# Patient Record
Sex: Female | Born: 1960
Health system: Southern US, Community
[De-identification: ages and names within clinical notes are randomized; demographics above are authoritative.]

## PROBLEM LIST (undated history)

## (undated) DIAGNOSIS — K219 Gastro-esophageal reflux disease without esophagitis: Secondary | ICD-10-CM

## (undated) DIAGNOSIS — I10 Essential (primary) hypertension: Secondary | ICD-10-CM

## (undated) DIAGNOSIS — R079 Chest pain, unspecified: Secondary | ICD-10-CM

## (undated) DIAGNOSIS — T7840XA Allergy, unspecified, initial encounter: Secondary | ICD-10-CM

## (undated) HISTORY — PX: BREAST BIOPSY: SHX20

## (undated) HISTORY — PX: TUBAL LIGATION: SHX77

## (undated) HISTORY — DX: Allergy, unspecified, initial encounter: T78.40XA

## (undated) HISTORY — DX: Essential (primary) hypertension: I10

## (undated) HISTORY — DX: Chest pain, unspecified: R07.9

## (undated) HISTORY — DX: Gastro-esophageal reflux disease without esophagitis: K21.9

---

## 1997-11-07 ENCOUNTER — Emergency Department (HOSPITAL_COMMUNITY): Admission: EM | Admit: 1997-11-07 | Discharge: 1997-11-07 | Payer: Self-pay | Admitting: Emergency Medicine

## 1998-04-04 ENCOUNTER — Encounter: Admission: RE | Admit: 1998-04-04 | Discharge: 1998-04-04 | Payer: Self-pay | Admitting: Sports Medicine

## 1998-05-05 ENCOUNTER — Encounter: Admission: RE | Admit: 1998-05-05 | Discharge: 1998-05-05 | Payer: Self-pay | Admitting: Family Medicine

## 1999-12-11 ENCOUNTER — Encounter: Admission: RE | Admit: 1999-12-11 | Discharge: 1999-12-11 | Payer: Self-pay | Admitting: Family Medicine

## 1999-12-12 ENCOUNTER — Encounter: Admission: RE | Admit: 1999-12-12 | Discharge: 1999-12-12 | Payer: Self-pay | Admitting: Family Medicine

## 1999-12-26 ENCOUNTER — Encounter: Admission: RE | Admit: 1999-12-26 | Discharge: 1999-12-26 | Payer: Self-pay | Admitting: Family Medicine

## 1999-12-26 ENCOUNTER — Encounter: Admission: RE | Admit: 1999-12-26 | Discharge: 1999-12-26 | Payer: Self-pay | Admitting: Sports Medicine

## 2001-12-19 ENCOUNTER — Encounter: Payer: Self-pay | Admitting: Family Medicine

## 2001-12-19 ENCOUNTER — Encounter: Admission: RE | Admit: 2001-12-19 | Discharge: 2001-12-19 | Payer: Self-pay | Admitting: Family Medicine

## 2002-07-22 ENCOUNTER — Encounter: Admission: RE | Admit: 2002-07-22 | Discharge: 2002-07-22 | Payer: Self-pay | Admitting: Family Medicine

## 2002-09-24 ENCOUNTER — Encounter: Admission: RE | Admit: 2002-09-24 | Discharge: 2002-09-24 | Payer: Self-pay | Admitting: Family Medicine

## 2003-02-16 ENCOUNTER — Encounter: Admission: RE | Admit: 2003-02-16 | Discharge: 2003-02-16 | Payer: Self-pay | Admitting: Sports Medicine

## 2003-11-02 ENCOUNTER — Emergency Department (HOSPITAL_COMMUNITY): Admission: EM | Admit: 2003-11-02 | Discharge: 2003-11-02 | Payer: Self-pay | Admitting: Emergency Medicine

## 2004-03-01 ENCOUNTER — Ambulatory Visit: Payer: Self-pay | Admitting: Family Medicine

## 2004-05-04 ENCOUNTER — Emergency Department (HOSPITAL_COMMUNITY): Admission: EM | Admit: 2004-05-04 | Discharge: 2004-05-04 | Payer: Self-pay | Admitting: Family Medicine

## 2004-05-11 ENCOUNTER — Ambulatory Visit: Payer: Self-pay | Admitting: Family Medicine

## 2004-06-20 ENCOUNTER — Ambulatory Visit: Payer: Self-pay | Admitting: Family Medicine

## 2004-09-11 ENCOUNTER — Encounter: Payer: Self-pay | Admitting: Family Medicine

## 2004-09-11 ENCOUNTER — Ambulatory Visit: Payer: Self-pay | Admitting: Family Medicine

## 2005-01-16 ENCOUNTER — Emergency Department (HOSPITAL_COMMUNITY): Admission: EM | Admit: 2005-01-16 | Discharge: 2005-01-16 | Payer: Self-pay | Admitting: Family Medicine

## 2005-07-06 ENCOUNTER — Ambulatory Visit: Payer: Self-pay | Admitting: Family Medicine

## 2005-07-17 ENCOUNTER — Ambulatory Visit: Payer: Self-pay | Admitting: Family Medicine

## 2005-07-31 ENCOUNTER — Encounter: Admission: RE | Admit: 2005-07-31 | Discharge: 2005-07-31 | Payer: Self-pay | Admitting: Sports Medicine

## 2005-08-07 ENCOUNTER — Ambulatory Visit: Payer: Self-pay | Admitting: Sports Medicine

## 2005-08-20 ENCOUNTER — Encounter: Admission: RE | Admit: 2005-08-20 | Discharge: 2005-08-20 | Payer: Self-pay | Admitting: Sports Medicine

## 2005-08-28 ENCOUNTER — Encounter (INDEPENDENT_AMBULATORY_CARE_PROVIDER_SITE_OTHER): Payer: Self-pay | Admitting: *Deleted

## 2005-08-29 ENCOUNTER — Ambulatory Visit: Payer: Self-pay | Admitting: Family Medicine

## 2005-09-26 ENCOUNTER — Ambulatory Visit: Payer: Self-pay | Admitting: Family Medicine

## 2006-05-31 ENCOUNTER — Ambulatory Visit: Payer: Self-pay | Admitting: Family Medicine

## 2006-05-31 ENCOUNTER — Encounter (INDEPENDENT_AMBULATORY_CARE_PROVIDER_SITE_OTHER): Payer: Self-pay | Admitting: Family Medicine

## 2006-05-31 LAB — CONVERTED CEMR LAB
Chlamydia, DNA Probe: NEGATIVE
GC Probe Amp, Genital: NEGATIVE

## 2006-06-27 DIAGNOSIS — N393 Stress incontinence (female) (male): Secondary | ICD-10-CM

## 2006-06-27 DIAGNOSIS — D649 Anemia, unspecified: Secondary | ICD-10-CM

## 2006-06-28 ENCOUNTER — Encounter (INDEPENDENT_AMBULATORY_CARE_PROVIDER_SITE_OTHER): Payer: Self-pay | Admitting: *Deleted

## 2007-05-14 ENCOUNTER — Encounter: Payer: Self-pay | Admitting: *Deleted

## 2007-05-20 ENCOUNTER — Ambulatory Visit: Payer: Self-pay | Admitting: Family Medicine

## 2007-06-10 ENCOUNTER — Encounter (INDEPENDENT_AMBULATORY_CARE_PROVIDER_SITE_OTHER): Payer: Self-pay | Admitting: Family Medicine

## 2007-06-10 ENCOUNTER — Ambulatory Visit: Payer: Self-pay | Admitting: Family Medicine

## 2007-06-10 DIAGNOSIS — R030 Elevated blood-pressure reading, without diagnosis of hypertension: Secondary | ICD-10-CM

## 2007-06-10 LAB — CONVERTED CEMR LAB
ALT: 9 units/L (ref 0–35)
AST: 14 units/L (ref 0–37)
Albumin: 4.1 g/dL (ref 3.5–5.2)
Alkaline Phosphatase: 70 units/L (ref 39–117)
BUN: 16 mg/dL (ref 6–23)
Bilirubin Urine: NEGATIVE
Blood in Urine, dipstick: NEGATIVE
CO2: 24 meq/L (ref 19–32)
Calcium: 9.6 mg/dL (ref 8.4–10.5)
Chloride: 105 meq/L (ref 96–112)
Cholesterol: 185 mg/dL (ref 0–200)
Creatinine, Ser: 0.72 mg/dL (ref 0.40–1.20)
Glucose, Bld: 82 mg/dL (ref 70–99)
Glucose, Urine, Semiquant: NEGATIVE
HCT: 36.7 % (ref 36.0–46.0)
HDL: 54 mg/dL (ref >39)
Hemoglobin: 11.9 g/dL — ABNORMAL LOW (ref 12.0–15.0)
LDL Cholesterol: 110 mg/dL — ABNORMAL HIGH (ref 0–99)
MCHC: 32.4 g/dL (ref 30.0–36.0)
MCV: 77.4 fL — ABNORMAL LOW (ref 78.0–100.0)
Platelets: 306 10*3/uL (ref 150–400)
Potassium: 4.1 meq/L (ref 3.5–5.3)
Protein, U semiquant: NEGATIVE
RBC: 4.74 M/uL (ref 3.87–5.11)
RDW: 13.9 % (ref 11.5–15.5)
Sodium: 139 meq/L (ref 135–145)
TSH: 1.746 microintl units/mL (ref 0.350–5.50)
Total Bilirubin: 0.4 mg/dL (ref 0.3–1.2)
Total CHOL/HDL Ratio: 3.4
Total Protein: 7.5 g/dL (ref 6.0–8.3)
Triglycerides: 107 mg/dL (ref <150)
Urobilinogen, UA: 0.2
VLDL: 21 mg/dL (ref 0–40)
WBC: 9 10*3/uL (ref 4.0–10.5)

## 2007-06-11 ENCOUNTER — Encounter (INDEPENDENT_AMBULATORY_CARE_PROVIDER_SITE_OTHER): Payer: Self-pay | Admitting: Family Medicine

## 2007-06-11 ENCOUNTER — Emergency Department (HOSPITAL_COMMUNITY): Admission: EM | Admit: 2007-06-11 | Discharge: 2007-06-11 | Payer: Self-pay | Admitting: Family Medicine

## 2007-06-11 LAB — CONVERTED CEMR LAB: Pap Smear: NORMAL

## 2007-06-13 ENCOUNTER — Encounter (INDEPENDENT_AMBULATORY_CARE_PROVIDER_SITE_OTHER): Payer: Self-pay | Admitting: Family Medicine

## 2007-09-02 ENCOUNTER — Telehealth: Payer: Self-pay | Admitting: *Deleted

## 2007-09-03 ENCOUNTER — Ambulatory Visit: Payer: Self-pay | Admitting: Family Medicine

## 2007-09-03 DIAGNOSIS — K219 Gastro-esophageal reflux disease without esophagitis: Secondary | ICD-10-CM

## 2008-07-05 ENCOUNTER — Telehealth: Payer: Self-pay | Admitting: Family Medicine

## 2009-03-08 ENCOUNTER — Emergency Department (HOSPITAL_COMMUNITY): Admission: EM | Admit: 2009-03-08 | Discharge: 2009-03-08 | Payer: Self-pay | Admitting: Emergency Medicine

## 2009-06-10 ENCOUNTER — Ambulatory Visit: Payer: Self-pay | Admitting: Family Medicine

## 2009-06-10 ENCOUNTER — Encounter: Payer: Self-pay | Admitting: Family Medicine

## 2009-06-15 ENCOUNTER — Ambulatory Visit (HOSPITAL_COMMUNITY): Admission: RE | Admit: 2009-06-15 | Discharge: 2009-06-15 | Payer: Self-pay | Admitting: Obstetrics & Gynecology

## 2009-06-16 ENCOUNTER — Encounter: Admission: RE | Admit: 2009-06-16 | Discharge: 2009-06-16 | Payer: Self-pay | Admitting: Obstetrics & Gynecology

## 2009-06-23 ENCOUNTER — Ambulatory Visit: Payer: Self-pay | Admitting: Family Medicine

## 2009-06-23 DIAGNOSIS — F39 Unspecified mood [affective] disorder: Secondary | ICD-10-CM | POA: Insufficient documentation

## 2009-06-23 DIAGNOSIS — E663 Overweight: Secondary | ICD-10-CM

## 2009-07-07 ENCOUNTER — Encounter: Payer: Self-pay | Admitting: Family Medicine

## 2009-07-07 ENCOUNTER — Ambulatory Visit: Payer: Self-pay | Admitting: Family Medicine

## 2009-07-08 LAB — CONVERTED CEMR LAB
AST: 12 units/L (ref 0–37)
Albumin: 3.7 g/dL (ref 3.5–5.2)
Alkaline Phosphatase: 55 units/L (ref 39–117)
Calcium: 8.9 mg/dL (ref 8.4–10.5)
MCV: 80.5 fL (ref 78.0–100.0)
RDW: 14.3 % (ref 11.5–15.5)
Sodium: 141 meq/L (ref 135–145)
Total Protein: 6.4 g/dL (ref 6.0–8.3)
Triglycerides: 72 mg/dL (ref <150)
VLDL: 14 mg/dL (ref 0–40)

## 2009-09-08 ENCOUNTER — Ambulatory Visit: Payer: Self-pay | Admitting: Family Medicine

## 2009-09-16 ENCOUNTER — Telehealth: Payer: Self-pay | Admitting: Family Medicine

## 2009-09-16 ENCOUNTER — Ambulatory Visit: Payer: Self-pay | Admitting: Family Medicine

## 2009-09-16 LAB — CONVERTED CEMR LAB
Bilirubin Urine: NEGATIVE
Nitrite: NEGATIVE
Protein, U semiquant: NEGATIVE
Specific Gravity, Urine: <1.005
Whiff Test: NEGATIVE
pH: 6

## 2009-09-17 ENCOUNTER — Emergency Department: Payer: Self-pay | Admitting: Unknown Physician Specialty

## 2009-10-25 ENCOUNTER — Telehealth: Payer: Self-pay | Admitting: Family Medicine

## 2009-10-26 ENCOUNTER — Ambulatory Visit: Payer: Self-pay | Admitting: Family Medicine

## 2010-02-03 ENCOUNTER — Telehealth: Payer: Self-pay | Admitting: Family Medicine

## 2010-02-06 ENCOUNTER — Ambulatory Visit: Payer: Self-pay | Admitting: Family Medicine

## 2010-04-12 ENCOUNTER — Encounter: Payer: Self-pay | Admitting: Family Medicine

## 2010-05-30 NOTE — Progress Notes (Signed)
Summary: triage   Phone Note Call from Patient Call back at Home Phone [phone]   Caller: Patient Summary of Call: Was seen for lymph node under her arm and given antibotic, but now seems to be coming up elsewhere on her body. Initial call taken by: Clydell Hakim,  October 25, 2009 1:39 PM  Follow-up for Phone Call        states she has boils coming up on her same arm & one near vagina. appt with pcp for tomorrow. to apply warm wet compresses or sit in warm bath. pt agreed with plan Follow-up by: Golden Circle RN,  October 25, 2009 2:00 PM

## 2010-05-30 NOTE — Assessment & Plan Note (Signed)
Summary: boils per pt/[place]/Laurie Herrera   Vital Signs:  Patient profile:   50 year old female Weight:      162.6 pounds BMI:     31.61 Temp:     98.2 degrees Herrera oral Pulse rate:   75 / minute BP sitting:   125 / 87  (right arm)  Vitals Entered By: Jimmy Footman, cma CC: Lymph nodes present for 1 week. Patient states that they are located on inner thigh and left side of back Is Patient Diabetic? No Pain Assessment Patient in pain? no        Primary Care Provider:  Quantia Grullon MD  CC:  Lymph nodes present for 1 week. Patient states that they are located on inner thigh and left side of back.  History of Present Illness: Laurie Herrera here for "bump" on L back and pelvic area.    "lump" on L axilla x 1 week and one in pelvic region.  painful to touch.  +redness around it.  no pustule.  Same thing occurred a couple of months ago and she was given Atbx and it went away.   no fever, chills, cough, rhinorrhea, nausea, vomiting, diarrhea has seasonal allergies (take flonase, claritin)  Habits & Providers  Alcohol-Tobacco-Diet     Tobacco Status: never  Current Medications (verified): 1)  Flonase 50 Mcg/act  Susp (Fluticasone Propionate) .Marland Kitchen.. 1 Spray Per Nostril Daily 2)  Claritin 10 Mg  Tabs (Loratadine) .Marland Kitchen.. 1 Tab By Mouth Daily 3)  Prilosec Otc 20 Mg  Tbec (Omeprazole Magnesium) .Marland Kitchen.. 1 Tab By Mouth Daily 4)  Womens Multivitamin Plus   Tabs (Multiple Vitamins-Minerals) .Marland Kitchen.. 1 Tab By Mouth Daily 5)  Climara Pro 0.045-0.015 Mg/day Ptwk (Estradiol-Levonorgestrel) .... Apply 1 Patch Per Week 6)  Black Cohost 7)  Vitamin D (Ergocalciferol) 50000 Unit Caps (Ergocalciferol) .Marland Kitchen.. 1 Cap By Mouth Once A Week For 8 Weeks, Then Take Over The Counter Vitamin D Supplement Daily 8)  Doxycycline Hyclate 100 Mg Caps (Doxycycline Hyclate) .Marland Kitchen.. 1 Cap By Mouth Two Times A Day X 7 Days  Allergies (verified): No Known Drug Allergies  Past History:  Past Medical History: Last updated: 03/08/Laurie [id number] by SVD h/o  syphilis 1980`s s/p treatment GERD ? HTN  Past Surgical History: Last updated: 03-08-Laurie Transvag and Abd U/S 2/16/Laurie: inhomogeneous myometrium: either adenomyosis with focal adnomyoma vs fibroid.   breast bx `94 by Dr Arnetha Gula `84 - 12/26/1999  Family History: Last updated: 07/05/2009 father - died age 80  asthma, COPD, HTN, CHF  mother - died age 17 CVA, HTN,  sister - asthma, sister died age 92 DM, ESRD on HD  Social History: Last updated: Jul 05, 2009 Lives boyfriend, [name].  Sexually active Divorced, 2 grown children.  Has 2 grandchildren.   Works 2rd shift at [employer].   No Tob/ETOH/Drugs   Code status: Full Code  Risk Factors: Alcohol Use: 0 (07/05/2009) Exercise: yes (07/05/2009)  Risk Factors: Smoking Status: never (10/26/2009)  Review of Systems       per hpi  Physical Exam  General:  Well-developed,well-nourished,in no acute distress; alert,appropriate and cooperative throughout examination. vitals reviewed.   Head:  normocephalic and atraumatic.   Breasts:  No mass, nodules, thickening, tenderness, bulging, retraction, inflamation, nipple discharge or skin changes noted.   Skin:  Left back/midaxillary line: 2cm diameter, abscess that is red, papular, no pustule, no fluctulance, no vesicle  Suprapelvic region: 1.5 cm diameter, abscess that is red, papular, no pustule, no  fluctulance, no vesicle  Cervical Nodes:  No lymphadenopathy noted Axillary Nodes:  No palpable lymphadenopathy Inguinal Nodes:  No significant adenopathy   Impression & Recommendations:  Problem # 1:  LOCALIZED SUPERFICIAL SWELLING MASS OR LUMP (ICD-782.2) Assessment New Lump most likely abscess.  Not fluctuant, so cannot I&D.  MMG earlier this year was negative.  Breast exam and axillary exam showed no masses/nodes.  Will give Doxycycline.  Pt to rtc in 2 wks if not better or if the abscess looks to be infected.  Recommended warm compress.    Orders: [facility]- Est Level  3 (72536)  Complete Medication List: 1)  Flonase 50 Mcg/act Susp (Fluticasone propionate) .Marland Kitchen.. 1 spray per nostril daily 2)  Claritin 10 Mg Tabs (Loratadine) .Marland Kitchen.. 1 tab by mouth daily 3)  Prilosec Otc 20 Mg Tbec (Omeprazole magnesium) .Marland Kitchen.. 1 tab by mouth daily 4)  Womens Multivitamin Plus Tabs (Multiple vitamins-minerals) .Marland Kitchen.. 1 tab by mouth daily 5)  Climara Pro 0.045-0.015 Mg/day Ptwk (Estradiol-levonorgestrel) .... Apply 1 patch per week 6)  Black Cohost  7)  Vitamin D (ergocalciferol) 50000 Unit Caps (Ergocalciferol) .Marland Kitchen.. 1 cap by mouth once a week for 8 weeks, then take over the counter vitamin d supplement daily 8)  Doxycycline Hyclate 100 Mg Caps (Doxycycline hyclate) .Marland Kitchen.. 1 cap by mouth two times a day x 7 days Prescriptions: DOXYCYCLINE HYCLATE 100 MG CAPS (DOXYCYCLINE HYCLATE) 1 cap by mouth two times a day x 7 days  #14 x 0   Entered and Authorized by:   Angeline Slim MD   Signed by:   Angeline Slim MD on 10/26/2009   Method used:   Electronically to        [facility]. [address] [phone]* (retail)       [address]       [place], [place]  [address]       Ph: [phone] or [phone]       Fax: [phone]   RxID:   [id number]

## 2010-05-30 NOTE — Assessment & Plan Note (Signed)
Summary: reax to bactrim?/[place]/ta   Vital Signs:  Patient profile:   50 year old female Height:      60.25 inches Weight:      156.6 pounds BMI:     30.44 Temp:     98.4 degrees F oral Pulse rate:   69 / minute BP sitting:   112 / 72  (left arm) Cuff size:   regular  Vitals Entered By: Garen Grams LPN (Sep 16, 2009 10:34 AM) CC: allergic reaction to bactrim/polyuria Is Patient Diabetic? No Pain Assessment Patient in pain? no        Primary Care Provider:  Cat Ta MD  CC:  allergic reaction to bactrim/polyuria.  History of Present Illness: reaction to bactim: patient being treated with a 10 day course of bactrim for area under left arm that is either lymphadenitis or an abscess (please refer to original progress note for details of this diagnosis).  patient presented on day 8 of course with what she perceived to be a reaction to bactrim.  she stopped taking the bactrim on day 7 of the course.  patient states that she also developed dry and swollen lips 3 days prior to encounter.  patient furthermore developed a pruritic rash on her arms and legs on day of clinic presentation.  pt also noted that lesion under her left axillae was getting better as the area.  pt stated that nodule under her arm came to a head and patient was able to drain the pus.    polyuria: during clinic visit mentioned that she was having urinary frequency that started about 2-3 days prior to visit.  patient denies dysuria or abdominal pain.  patient does report spotting, vaginal itching that transists to a burn upon scratching, and constipation.  Habits & Providers  Alcohol-Tobacco-Diet     Tobacco Status: never  Allergies: No Known Drug Allergies  Review of Systems       as noted in hpi  Physical Exam  General:  NAD alert and well-developed.   vital signs wnl Genitalia:  normal introitus, no external lesions, mucosa pink and moist, and no vaginal or cervical lesions.  white discharge around cervix  and blood from cervix noted  Skin:  bilateral upper and lower extremity erythematous maculopapular lesions (or minor elevation of lesions) and indivual and confluent erythematous macular lesions on back.  L underarm with healing nonerythematous fluctuant nodule   Impression & Recommendations:  Problem # 1:  ADVERSE DRUG REACTION (ICD-995.20) Assessment New  Patient with likely an allergy to sulfas based on history and physical exam.  Told patient to stop using bactrim and will finish 10 day course with clindamycin as this is another of the top choices for treating an abscess especially if there is concern for MRSA.  Called patient and informed her of antibiotic change  Orders: [facility]- Est Level  3 (56213)  Problem # 2:  POLYURIA (YQM-578.46) Assessment: New Patient with increased urinary frequency likely due to yeast vulvovaginitis, especially in the context of recent antibiotic use and hpi.  Was concerned for a UTI because of increased urinary frequency; bacteria, wbc count and small leukocytes in urine studies.  However, patient has been treated for 7 days with bactrim (and UTI treatment is a 3 day course) and so possibly results from urine are a contaminant.  Culture would have to confirm absence or presence of any kind of treatable bacteria; however because of the bactrim treatment, cultures would likely not be accurate.  Therefore, will  treat patient for yeast infection as per her wet mount and likely patient's symptoms will resolve.  Called and informed patient about treating the yeast infection.  Called patient back to clarify that she could use an OTC preparation.  Also told patient that if symptoms get worse (especially in light of considering a UTI) then she should return to the [facility]  Orders: Urinalysis-[facility] (00000) [facility]- Est Level  3 (60454)  Complete Medication List: 1)  Flonase 50 Mcg/act Susp (Fluticasone propionate) .Marland Kitchen.. 1 spray per nostril daily 2)  Claritin 10 Mg Tabs  (Loratadine) .Marland Kitchen.. 1 tab by mouth daily 3)  Prilosec Otc 20 Mg Tbec (Omeprazole magnesium) .Marland Kitchen.. 1 tab by mouth daily 4)  Womens Multivitamin Plus Tabs (Multiple vitamins-minerals) .Marland Kitchen.. 1 tab by mouth daily 5)  Climara Pro 0.045-0.015 Mg/day Ptwk (Estradiol-levonorgestrel) .... Apply 1 patch per week 6)  Tessalon Perles 100 Mg Caps (Benzonatate) .Marland Kitchen.. 1 capsule by mouth three times a day for cough 7)  Black Cohost  8)  Vitamin D (ergocalciferol) 50000 Unit Caps (Ergocalciferol) .Marland Kitchen.. 1 cap by mouth once a week for 8 weeks, then take over the counter vitamin d supplement daily 9)  Bactrim Ds 800-160 Mg Tabs (Sulfamethoxazole-trimethoprim) .... 2 tablets twice daily for 10 days 10)  Promethazine Hcl 25 Mg Tabs (Promethazine hcl) .... Take one by mouth q6 hours as needed nausea 11)  Clindamycin Hcl 300 Mg Caps (Clindamycin hcl) .... Take 1 tablet three times a day by mouth  [facility]  Patient Instructions: 1)  Ms. Mayer Camel, I'm sorry that you had a reaction to Bactrim. 2)  We are going to treat you for 2 more days with DOXYCYCLINE. 3)  We will call you for the results of your urinalysis and your vaginal sample collection.  We will instruct you about medication to take based on what the results. 4)  You can take OTC BENADRYL for the relief of your itching 5)  Please follow up with PCP or another physician within the week or if your symptoms get worse. 6)  Thank you and be blessed!  Prescriptions: CLINDAMYCIN HCL 300 MG CAPS (CLINDAMYCIN HCL) take 1 tablet three times a day by mouth  #9 x 0   Entered and Authorized by:   Magnus Ivan MD   Signed by:   Magnus Ivan MD on 09/16/2009   Method used:   Electronically to        [facility] [address] [phone]* (retail)       [address]       [place], [place]  [address]       Ph: [phone] or [phone]       Fax: [phone]   RxID:   [id number]   Laboratory Results   Urine  Tests  Date/Time Received: Sep 16, 2009 11:41 AM  Date/Time Reported: Sep 16, 2009 11:57 AM   Routine Urinalysis   Color: lt. yellow Appearance: Clear Glucose: negative   (Normal Range: Negative) Bilirubin: negative   (Normal Range: Negative) Ketone: negative   (Normal Range: Negative) Spec. Gravity: <1.005   (Normal Range: 1.003-1.035) Blood: trace-lysed   (Normal Range: Negative) pH: 6.0   (Normal Range: 5.0-8.0) Protein: negative   (Normal Range: Negative) Urobilinogen: 0.2   (Normal Range: 0-1) Nitrite: negative   (Normal Range: Negative) Leukocyte Esterace: small   (Normal Range: Negative)  Urine Microscopic WBC/HPF: 0-3 RBC/HPF: rare Bacteria/HPF: 1+ Epithelial/HPF: 0-3 Yeast/HPF: few  Comments: ...........test performed by...........Marland KitchenTerese Door, CMA  Date/Time Received: Sep 16, 2009 11:41 AM  Date/Time Reported: Sep 16, 2009 11:50 AM   [employer] Source: vaginal WBC/hpf: 10-20 Bacteria/hpf: 3+  Rods Clue cells/hpf: none  Negative whiff Yeast/hpf: many Trichomonas/hpf: none Comments: ...........test performed by...........Marland KitchenTerese Door, CMA

## 2010-05-30 NOTE — Miscellaneous (Signed)
Summary: walk in   Clinical Lists Changes walked in c/o cold symptoms x 1 wk. works at [employer] has been exposed to flu. taking mucinex & tylenol. has sore throat. work in at 1:30. aware of wait.Golden Circle RN  June 10, 2009 11:58 AM

## 2010-05-30 NOTE — Progress Notes (Signed)
Summary: triage   Phone Note Call from Patient Call back at Home Phone [phone]   Caller: Patient Summary of Call: started taking Bactrim  and now is broken out all over & lip swollen, Initial call taken by: De Nurse,  Sep 16, 2009 9:08 AM  Follow-up for Phone Call        LM asking that she call back asap Follow-up by: Golden Circle RN,  Sep 16, 2009 9:10 AM  Additional Follow-up for Phone Call Additional follow up Details #1::        has taken 2-3 days. now has above symptoms. not to take any more & come in here now. she agreed Additional Follow-up by: Golden Circle RN,  Sep 16, 2009 9:16 AM

## 2010-05-30 NOTE — Assessment & Plan Note (Signed)
Summary: cough,congestion/[place]/Ta   Vital Signs:  Patient profile:   50 year old female Height:      60.25 inches Weight:      160 pounds BMI:     31.10 Temp:     98.2 degrees F oral Pulse rate:   81 / minute BP sitting:   125 / 82  (left arm) Cuff size:   regular  Vitals Entered By: Tessie Fass CMA (June 10, 2009 2:00 PM) CC: cough and congestion x 1 week Is Patient Diabetic? No Pain Assessment Patient in pain? no        Primary Care Provider:  Alanson Puls CRAWFORD MD  CC:  cough and congestion x 1 week.  History of Present Illness: Patient reports nasal congestion, runny nose, scratchy throat and cough for one week.  Cough is worse in morning coughing up small amounts of clear mucus.  States she has been using OTC Mucinex and tylenol for symptoms with minimal relief.  Denies fever and malaise but admits to some chills at night.  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 Currently on Flonase and Claritin to manage allergies, she does not smoke but there is a smoker in the home.  Habits & Providers  Alcohol-Tobacco-Diet     Tobacco Status: never  Current Medications (verified): 1)  Flonase 50 Mcg/act  Susp (Fluticasone Propionate) .Marland Kitchen.. 1 Spray Per Nostril Daily 2)  Claritin 10 Mg  Tabs (Loratadine) .Marland Kitchen.. 1 Tab By Mouth Daily 3)  Prilosec Otc 20 Mg  Tbec (Omeprazole Magnesium) .Marland Kitchen.. 1 Tab By Mouth Daily 4)  Womens Multivitamin Plus   Tabs (Multiple Vitamins-Minerals) .Marland Kitchen.. 1 Tab By Mouth Daily 5)  Hydromet 5-1.5 Mg/39ml Syrp (Hydrocodone-Homatropine) .... Take One Tsp By Mouth Three Times A Day As Needed Cough, 120cc  Allergies (verified): No Known Drug Allergies  Review of Systems General:  Complains of chills; denies fatigue, fever, loss of appetite, malaise, sweats, and weakness. Eyes:  Denies blurring, discharge, eye irritation, and light sensitivity. ENT:  Complains of hoarseness, nasal congestion, and  postnasal drainage; denies decreased hearing, difficulty swallowing, ear discharge, earache, ringing in ears, sinus pressure, and sore throat. CV:  Denies chest pain or discomfort, difficulty breathing at night, difficulty breathing while lying down, fainting, fatigue, lightheadness, and palpitations. Resp:  Complains of cough and sputum productive; denies chest discomfort, shortness of breath, and wheezing. GI:  Denies diarrhea, nausea, and vomiting.  Physical Exam  General:  Well-developed,well-nourished,in no acute distress; alert,appropriate and cooperative throughout examination Head:  Normocephalic and atraumatic without obvious abnormalities. No apparent alopecia or balding. Eyes:  No corneal or conjunctival inflammation noted. EOMI. Perrla. Vision grossly normal. Ears:  External ear exam shows no significant lesions or deformities.  Otoscopic examination reveals clear canals, tympanic membranes are intact bilaterally without bulging, retraction, inflammation or discharge. Hearing is grossly normal bilaterally. Nose:  External nasal examination shows no deformity or inflammation. Nasal mucosa are pink and moist without lesions or exudates. Mouth:  Oral mucosa and oropharynx without lesions or exudates.  Teeth in good repair.  Mucous membranes pink and moist. Lungs:  Normal respiratory effort, chest expands symmetrically. Lungs are clear to auscultation, no crackles or wheezes. Heart:  Normal rate and regular rhythm. S1 and S2 normal without gallop, murmur, click, rub or other extra sounds. Pulses:  R and L carotid,radial,femoral,dorsalis pedis and posterior tibial pulses are full and equal bilaterally Skin:  Intact without suspicious lesions or rashes Cervical  Nodes:  No lymphadenopathy noted   Impression & Recommendations:  Problem # 1:  VIRAL URI (ICD-465.9)  Encouraged to continue to take allergy medications as prescribed.  Cough suppressant for symptom management.  Patient educated  on viral illnessess.  Instructed to return if symptoms persist or worsen. Her updated medication list for this problem includes:    Claritin 10 Mg Tabs (Loratadine) .Marland Kitchen... 1 tab by mouth daily    Hydromet 5-1.5 Mg/37ml Syrp (Hydrocodone-homatropine) .Marland Kitchen... Take one tsp by mouth three times a day as needed cough, 120cc  Orders: [facility]- Est Level  3 (40102)  Problem # 2:  COUGH (ICD-786.2) Treat short term with narcotic cough suppressant Orders: [facility]- Est Level  3 (72536)  Complete Medication List: 1)  Flonase 50 Mcg/act Susp (Fluticasone propionate) .Marland Kitchen.. 1 spray per nostril daily 2)  Claritin 10 Mg Tabs (Loratadine) .Marland Kitchen.. 1 tab by mouth daily 3)  Prilosec Otc 20 Mg Tbec (Omeprazole magnesium) .Marland Kitchen.. 1 tab by mouth daily 4)  Womens Multivitamin Plus Tabs (Multiple vitamins-minerals) .Marland Kitchen.. 1 tab by mouth daily 5)  Hydromet 5-1.5 Mg/49ml Syrp (Hydrocodone-homatropine) .... Take one tsp by mouth three times a day as needed cough, 120cc  Patient Instructions: 1)  This is a viral illness and will improve in the next couple of days. 2)  Get plenty of rest and fluids. 3)  Take Hydromet for cough as needed, careful this medicine can make you sleepy. 4)  Return if symptoms persist or if get worse. Prescriptions: HYDROMET 5-1.5 MG/5ML SYRP (HYDROCODONE-HOMATROPINE) take one tsp by mouth three times a day as needed cough, 120cc Brand medically necessary #1 x 0   Entered and Authorized by:   Luretha Murphy NP   Signed by:   Luretha Murphy NP on 06/10/2009   Method used:   Print then Give to Patient   RxID:   [id number]

## 2010-05-30 NOTE — Assessment & Plan Note (Signed)
Summary: boils under arms/[place]/ta-can only come after work   Vital Signs:  Patient profile:   50 year old female Height:      60.25 inches Weight:      168 pounds BMI:     32.66 Temp:     98.2 degrees F oral Pulse rate:   81 / minute BP sitting:   135 / 84  (left arm) Cuff size:   regular  Vitals Entered By: Jimmy Footman, CMA (February 06, 2010 4:11 PM) CC: boils under left arm Is Patient Diabetic? No   Primary Care Provider:  Cat Ta MD  CC:  boils under left arm.  History of Present Illness: 50 yo F:  1. Boils: Hx of abscess under arm that she "popped" several days ago that has now become a cluster of "bumps." Left side. No other rash. Denies fever/chills, N/V/D, CP, SOB, HA, dizziness. Previously treated with Doxy after having reaction to Bactrim.  Habits & Providers  Alcohol-Tobacco-Diet     Tobacco Status: never  Current Medications (verified): 1)  Flonase 50 Mcg/act  Susp (Fluticasone Propionate) .Marland Kitchen.. 1 Spray Per Nostril Daily 2)  Claritin 10 Mg  Tabs (Loratadine) .Marland Kitchen.. 1 Tab By Mouth Daily 3)  Prilosec Otc 20 Mg  Tbec (Omeprazole Magnesium) .Marland Kitchen.. 1 Tab By Mouth Daily 4)  Womens Multivitamin Plus   Tabs (Multiple Vitamins-Minerals) .Marland Kitchen.. 1 Tab By Mouth Daily 5)  Climara Pro 0.045-0.015 Mg/day Ptwk (Estradiol-Levonorgestrel) .... Apply 1 Patch Per Week 6)  Black Cohost 7)  Vitamin D (Ergocalciferol) 50000 Unit Caps (Ergocalciferol) .Marland Kitchen.. 1 Cap By Mouth Once A Week For 8 Weeks, Then Take Over The Counter Vitamin D Supplement Daily 8)  Doxycycline Hyclate 100 Mg Caps (Doxycycline Hyclate) .Marland Kitchen.. 1 Cap By Mouth Two Times A Day X 7 Days  Allergies (verified): No Known Drug Allergies PMH-FH-SH reviewed for relevance  Review of Systems      See HPI  Physical Exam  General:  Well-developed,well-nourished, in no acute distress; alert, appropriate and cooperative throughout examination. Vitals reviewed.   Skin:  Left Mid-Axillary Line: 4-5 (< 2 cm diameter) lesions - red,  papular, no pustule, no fluctulance, or vesicles. No other evidence of cellulitis. Axillary Nodes:  No palpable lymphadenopathy.   Impression & Recommendations:  Problem # 1:  LOCALIZED SUPERFICIAL SWELLING MASS OR LUMP (ICD-782.2) Assessment New  No red flags. Not ideal for I&D today. Rx Doxy. Red Flags given. As patient has had issues with abscesses in both axilla and groin in the past, may consider Dx Hydradinitis.  Orders: [facility]- Est Level  3 ([address]109)  Complete Medication List: 1)  Flonase 50 Mcg/act Susp (Fluticasone propionate) .Marland Kitchen.. 1 spray per nostril daily 2)  Claritin 10 Mg Tabs (Loratadine) .Marland Kitchen.. 1 tab by mouth daily 3)  Prilosec Otc 20 Mg Tbec (Omeprazole magnesium) .Marland Kitchen.. 1 tab by mouth daily 4)  Womens Multivitamin Plus Tabs (Multiple vitamins-minerals) .Marland Kitchen.. 1 tab by mouth daily 5)  Climara Pro 0.045-0.015 Mg/day Ptwk (Estradiol-levonorgestrel) .... Apply 1 patch per week 6)  Black Cohost  7)  Vitamin D (ergocalciferol) 50000 Unit Caps (Ergocalciferol) .Marland Kitchen.. 1 cap by mouth once a week for 8 weeks, then take over the counter vitamin d supplement daily 8)  Doxycycline Hyclate 100 Mg Caps (Doxycycline hyclate) .Marland Kitchen.. 1 cap by mouth two times a day x 7 days  Patient Instructions: 1)  It was nice to meet you today! 2)  I am prescribing Doxycycline for your bumps. 3)  Please see if you think  that HYDRADINITIS applies to you. Prescriptions: DOXYCYCLINE HYCLATE 100 MG CAPS (DOXYCYCLINE HYCLATE) 1 cap by mouth two times a day x 7 days  #14 x 0   Entered and Authorized by:   Helane Rima DO   Signed by:   Helane Rima DO on 02/06/2010   Method used:   Print then Give to Patient   RxID:   [id number]

## 2010-05-30 NOTE — Assessment & Plan Note (Signed)
Summary: MEET NEW DR/KH   Vital Signs:  Patient profile:   50 year old female Height:      60.25 inches Weight:      160 pounds BMI:     31.10 Pulse rate:   85 / minute BP sitting:   153 / 89  (right arm)  Vitals Entered By: Tessie Fass CMA (June 23, 2009 9:43 AM)  Nutrition Counseling: Patient's BMI is greater than 25 and therefore counseled on weight management options.  Serial Vital Signs/Assessments:  Time      Position  BP       Pulse  Resp  Temp     By                     140/85                         Eyad Rochford MD  CC: meet new doctor. right hip hurts, when laying down. has been going on x 1mos. Is Patient Diabetic? No Pain Assessment Patient in pain? no        Primary Care Provider:  Yoana Staib MD  CC:  meet new doctor. right hip hurts and when laying down. has been going on x 1mos..  History of Present Illness: GYN: Pt saw Dr Tamela Oddi of [facility] and had pap done.  On period right now.  For the last year she has had times when she would not have menses for 2 months, then when she has period it would be very heavy 6-7 days, change pads every 1 1/2 - 2 hrs.  Dr Tamela Oddi prescribed Climera Pro 0.045mg  patch once per week, started 06/10/09..  Need to get result of pap.   Mood: for one year now she has become more irritable with her grandchildren, which is different for her.  She is usually very mild tempered.  Hot flashes:  worse at night.  Taking black cohost, started Jan 2011.    MMG: had it done last week.    Hip pain: left side.  Feels like it is in the joint. Sometimes feels stiffness in body when she wakes up.  This gets better during the day.    Obesity: BMI 31. not exercising. not dieting.     Habits & Providers  Alcohol-Tobacco-Diet     Alcohol drinks/day: 0     Alcohol Counseling: not indicated; patient does not drink     Tobacco Status: never  Exercise-Depression-Behavior     Does Patient Exercise: yes     Exercise Counseling: to improve  exercise regimen     Type of exercise: treadmill     Exercise (avg: min/session): <30     Times/week: <3     Have you felt down or hopeless? no     Have you felt little pleasure in things? no     Depression Counseling: not indicated; screening negative for depression     STD Risk: never     Drug Use: never     Seat Belt Use: always     Sun Exposure: infrequent  Current Medications (verified): 1)  Flonase 50 Mcg/act  Susp (Fluticasone Propionate) .Marland Kitchen.. 1 Spray Per Nostril Daily 2)  Claritin 10 Mg  Tabs (Loratadine) .Marland Kitchen.. 1 Tab By Mouth Daily 3)  Prilosec Otc 20 Mg  Tbec (Omeprazole Magnesium) .Marland Kitchen.. 1 Tab By Mouth Daily 4)  Womens Multivitamin Plus   Tabs (Multiple Vitamins-Minerals) .Marland Kitchen.. 1 Tab By Mouth Daily 5)  Climara Pro 0.045-0.015 Mg/day Ptwk (Estradiol-Levonorgestrel) .... Apply 1 Patch Per Week 6)  Tessalon Perles 100 Mg Caps (Benzonatate) .Marland Kitchen.. 1 Capsule By Mouth Three Times A Day For Cough  Allergies (verified): No Known Drug Allergies  Past History:  Past Medical History: G2P2002 by [facility] h/o syphilis 1980`s s/p treatment GERD ? HTN  Past Surgical History: Transvag and Abd U/S 06/15/09: inhomogeneous myometrium: either adenomyosis with focal adnomyoma vs fibroid.   breast bx `94 by Dr Arnetha Gula `84 - 12/26/1999  Family History: father - died age 70  asthma, COPD, HTN, CHF  mother - died age 65 CVA, HTN,  sister - asthma, sister died age 64 DM, ESRD on HD  Social History: Lives boyfriend, [name].  Sexually active Divorced, 2 grown children.  Has 2 grandchildren.   Works 2rd shift at [employer].   No Tob/ETOH/Drugs   Code status: Full Code Does Patient Exercise:  yes STD Risk:  never Drug Use:  never Seat Belt Use:  always Sun Exposure-Excessive:  infrequent  Review of Systems  The patient denies anorexia, fever, weight loss, chest pain, syncope, abdominal pain, hematuria, incontinence, and difficulty walking.    Physical  Exam  General:  Well-developed,well-nourished,in no acute distress; alert,appropriate and cooperative throughout examination Head:  Normocephalic and atraumatic without obvious abnormalities. No apparent alopecia or balding. Eyes:  No corneal or conjunctival inflammation noted. EOMI. Perrla. Funduscopic exam benign, without hemorrhages, exudates or papilledema. Vision grossly normal. Mouth:  Oral mucosa and oropharynx without lesions or exudates.  Teeth in good repair. Neck:  supple and full ROM.   Chest Wall:  No deformities, masses, or tenderness noted. Lungs:  Normal respiratory effort, chest expands symmetrically. Lungs are clear to auscultation, no crackles or wheezes. Heart:  Normal rate and regular rhythm. S1 and S2 normal without gallop, murmur, click, rub or other extra sounds. Abdomen:  Bowel sounds positive,abdomen soft and non-tender without masses, organomegaly or hernias noted. Msk:  normal ROM, no joint tenderness, no joint swelling, no joint warmth, no redness over joints, and no joint deformities.   Pulses:  R and L carotid,radial,femoral,dorsalis pedis and posterior tibial pulses are full and equal bilaterally Extremities:  No clubbing, cyanosis, edema, or deformity noted with normal full range of motion of all joints.   Neurologic:  No cranial nerve deficits noted. Station and gait are normal. Plantar reflexes are down-going bilaterally. DTRs are symmetrical throughout. Sensory, motor and coordinative functions appear intact. Skin:  Intact without suspicious lesions or rashes Cervical Nodes:  No lymphadenopathy noted Axillary Nodes:  No palpable lymphadenopathy Psych:  Pt became tearful when we discussed Code status.  She states that she has discussed this with her children and was unemotional about it so she could not understand why she is crying now. Oriented X3, memory intact for recent and remote, normally interactive, good eye contact, not anxious appearing, not depressed  appearing, not agitated, not suicidal, and not homicidal.     Impression & Recommendations:  Problem # 1:  MOOD SWINGS (ICD-296.99) Assessment New Emotional lability due to perimenopausal stage vs depression?  Pt was was prescribed Premarin patch per Dr Tamela Oddi.  She is also trying black cohost. I've offered to give SSRI if pt feels that she needs it.  Will monitor for now. Will check TSH. Future Orders: TSH-[facility] ([id number]) ... 06/02/2010  Problem # 2:  OBESITY, CLASS I (ICD-278.02) Assessment: Unchanged BMI 31.  Discussed that for weight to be wnl and not overweight, pt  should weigh 130lbs, which means a 30lbs weight loss.  Pt will try to work on dieting, eating more vegetables, and exercising.  We discussed portion size and Dr Gerilyn Pilgrim' plate method.  RTC in 4 months.  Future Orders: Lipid-[facility] ([id number]) ... 06/03/2010 Comp Met-[facility] ([id number]) ... 06/03/2010 CBC-[facility] ([id number]) ... 06/03/2010 Vit D, 25 OH-[facility] ([id number]) ... 06/10/2010 TSH-[facility] [id number]) ... 06/02/2010  Problem # 3:  ELEVATED BP READING WITHOUT DX HYPERTENSION (ICD-796.2) Assessment: Unchanged BP better with recheck (140/85) but still elevated. Will monitor. Pt to come back to clinic to see nurse for recheck.  Weight loss may help to lower BP.  I will call pt to discuss BP after nurse check.   Future Orders: Comp Met-[facility] [id number]) ... 06/03/2010 CBC-[facility] ([id number]) ... 06/03/2010  Problem # 4:  ANEMIA, OTHER, UNSPECIFIED (ICD-285.9) Assessment: Unchanged Has Dx of this in past.  Will check cbc.  NOt on iron.  Still menstruating.  Future Orders: CBC-[facility] ([id number]) ... 06/03/2010  Problem # 5:  HIP PAIN, LEFT (ICD-719.45) Assessment: New Most likely rhematoid in etiology. Advised ibuprofen 400mg  -600mg  q6 as needed pain.  Will monitor for now.  Will check Vit D.   Complete Medication List: 1)  Flonase 50 Mcg/act Susp (Fluticasone propionate) .Marland Kitchen.. 1 spray per nostril daily 2)  Claritin 10 Mg Tabs  (Loratadine) .Marland Kitchen.. 1 tab by mouth daily 3)  Prilosec Otc 20 Mg Tbec (Omeprazole magnesium) .Marland Kitchen.. 1 tab by mouth daily 4)  Womens Multivitamin Plus Tabs (Multiple vitamins-minerals) .Marland Kitchen.. 1 tab by mouth daily 5)  Climara Pro 0.045-0.015 Mg/day Ptwk (Estradiol-levonorgestrel) .... Apply 1 patch per week 6)  Tessalon Perles 100 Mg Caps (Benzonatate) .Marland Kitchen.. 1 capsule by mouth three times a day for cough 7)  Black Cohost   Patient Instructions: 1)  Please schedule appointment with Dr Janalyn Harder 4 months. 2)  Please schedule a follow-up appointment in 2-3 weeks with nurse for blood pressure check.  3)  Please make appt for cholesterol check.  This has to be fasting, so no food or drink after midnight the night before appointment. 4)  It is important that you exercise reguarly at least 20 minutes 5 times a week. If you develop chest pain, have severe difficulty breathing, or feel very tired, stop exercising immediately and seek medical attention.  5)  You need to lose weight. Consider a lower calorie diet and regular exercise.  6)  Take an Aspirin every day.  7)  For hip pain you can try Ibuprofen 400mg  -600mg  by mouth up to 4 times a day as needed. Prescriptions: TESSALON PERLES 100 MG CAPS (BENZONATATE) 1 capsule by mouth three times a day for cough  #30 x 1   Entered and Authorized by:   Angeline Slim MD   Signed by:   Angeline Slim MD on 06/23/2009   Method used:   Electronically to        [facility]. [address]. [phone]* (retail)       [address]       [place], [place]  [address]       Ph: [phone] or [phone]       Fax: [phone]   RxID:   [id number] CLIMARA PRO 0.045-0.015 MG/DAY PTWK (ESTRADIOL-LEVONORGESTREL) Apply 1 patch per week  #4 x 12   Entered and Authorized by:   Angeline Slim MD   Signed by:   Angeline Slim MD on 06/23/2009   Method used:   Electronically to  [facility] [address] [phone]* (retail)       [address]       [place], [place]  [address]        Ph: [phone] or [phone]       Fax: [phone]   RxID:   [id number]    Mammogram  Procedure date:  06/16/2009  Findings:      No specific mammographic evidence of malignancy.  Assessment: BIRADS 1. Done at [facility].   Comments:      Screening mammogram in 1 year.      Appended Document: MEET NEW DR/KH

## 2010-05-30 NOTE — Progress Notes (Signed)
Summary: triage   Phone Note Call from Patient Call back at [phone]   Caller: Patient Summary of Call: pt has more boils under her arm - a cluster of them - wants to know if DOXYCYCLINE HYCLATE 100 MG can be called in or does she have to be seen Initial call taken by: De Nurse,  February 03, 2010 1:54 PM  Follow-up for Phone Call        told her she would need to be seen. unable to make it in today. appt next wk with Dr. Earlene Plater. pt just started a new job & cannot come until late in day.advised warm cloths to area & use of OTC for pain until seen. she agreed with plan Follow-up by: Golden Circle RN,  February 03, 2010 2:33 PM

## 2010-05-30 NOTE — Assessment & Plan Note (Signed)
Summary: knot under arm,df   Vital Signs:  Patient profile:   50 year old female Height:      60.25 inches Weight:      163 pounds BMI:     31.68 Temp:     98.0 degrees F oral Pulse rate:   71 / minute BP sitting:   132 / 81  (right arm) Cuff size:   regular  Vitals Entered By: Tessie Fass CMA (Sep 08, 2009 8:56 AM) CC: knot under left arm Is Patient Diabetic? No   Primary Care Provider:  Cat Ta MD  CC:  knot under left arm.  History of Present Illness: CC: knot under L arm  Sunday felt knott under left arm, small and tender to touch.  Now getting bigger.  Now tender all the time especially to touch.  Painful at night especially when trying to sleep.  No skin changes, redness.  + swelling under arm.  No injury to area.  No breast changes.    UTD on mammogram (2011) (pap smear and mammo done per Dr. Tamela Oddi, told has uterine fibroids).  Told premenopausal.  Started on climera (low dose estradiol / levonorgestrel) for hot flashes, vag atrophy.  Did have benign tumor removed from right breast about 20 years ago, doesn't remember specifics.  No fevers/chills, abd pain/ chest pain, SOB. + nausea and palpitations and cough.  Habits & Providers  Alcohol-Tobacco-Diet     Tobacco Status: never  Allergies: No Known Drug Allergies  Physical Exam  General:  Well-developed,well-nourished,in no acute distress; alert,appropriate and cooperative throughout examination Chest Wall:  No deformities, masses, or tenderness noted. Breasts:  No mass, nodules, thickening, tenderness, bulging, retraction, inflamation, nipple discharge or skin changes noted.  right breast upper outer quadrant with scar from biopsy.  Left axilla with superficial nodule with swelling/erythema/warmth, tender to palpation   Impression & Recommendations:  Problem # 1:  LOCALIZED SUPERFICIAL SWELLING MASS OR LUMP (ICD-782.2)  lymphadenitis vs abscess.  no fluctuance/pus so no indication for I&D.  Treat  with warm compresses to area three times a day, course of antibiotics.  UTD on mammogram WNL (05/2009), no changes in breast tissue.  close f/u in case anything changes then would consider breast ultrasound.  Orders: [facility]- Est Level  3 ([address])  Complete Medication List: 1)  Flonase 50 Mcg/act Susp (Fluticasone propionate) .Marland Kitchen.. 1 spray per nostril daily 2)  Claritin 10 Mg Tabs (Loratadine) .Marland Kitchen.. 1 tab by mouth daily 3)  Prilosec Otc 20 Mg Tbec (Omeprazole magnesium) .Marland Kitchen.. 1 tab by mouth daily 4)  Womens Multivitamin Plus Tabs (Multiple vitamins-minerals) .Marland Kitchen.. 1 tab by mouth daily 5)  Climara Pro 0.045-0.015 Mg/day Ptwk (Estradiol-levonorgestrel) .... Apply 1 patch per week 6)  Tessalon Perles 100 Mg Caps (Benzonatate) .Marland Kitchen.. 1 capsule by mouth three times a day for cough 7)  Black Cohost  8)  Vitamin D (ergocalciferol) 50000 Unit Caps (Ergocalciferol) .Marland Kitchen.. 1 cap by mouth once a week for 8 weeks, then take over the counter vitamin d supplement daily 9)  Bactrim Ds 800-160 Mg Tabs (Sulfamethoxazole-trimethoprim) .... 2 tablets twice daily for 10 days 10)  Promethazine Hcl 25 Mg Tabs (Promethazine hcl) .... Take one by mouth q6 hours as needed nausea  Patient Instructions: 1)  Return in 10 days for f/u. 2)  Looks either like inflammed lymph node or small abscess that is forming. 3)  Treat with warm compresses to area three times a day and course of antibiotics (bactrim DS 2 tablets twice daily  for 10 days). 4)  Return sooner if fevers, area coming to a head, or if worsening.   Prescriptions: PROMETHAZINE HCL 25 MG TABS (PROMETHAZINE HCL) take one by mouth q6 hours as needed nausea  #30 x 1   Entered and Authorized by:   Eustaquio Boyden  MD   Signed by:   Eustaquio Boyden  MD on 09/08/2009   Method used:   Print then Give to Patient   RxID:   [id number] BACTRIM DS 800-160 MG TABS (SULFAMETHOXAZOLE-TRIMETHOPRIM) 2 tablets twice daily for 10 days  #40 x 0   Entered and Authorized by:    Eustaquio Boyden  MD   Signed by:   Eustaquio Boyden  MD on 09/08/2009   Method used:   Print then Give to Patient   RxID:   [id number]    Prevention & Chronic Care Immunizations   Influenza vaccine: Not documented    Tetanus booster: 07/02/2002: Done.    Pneumococcal vaccine: Not documented  Colorectal Screening   Hemoccult: Not documented    Colonoscopy: Not documented  Other Screening   Pap smear: normal  (06/11/2007)   Pap smear due: 05/2010    Mammogram: No specific mammographic evidence of malignancy.  Assessment: BIRADS 1. Done at [facility].   (06/16/2009)   Mammogram action/deferral: Screening mammogram in 1 year.     (06/16/2009)   Smoking status: never  (09/08/2009)  Lipids   Total Cholesterol: 152  (07/07/2009)   LDL: 102  (07/07/2009)   LDL Direct: Not documented   HDL: 36  (07/07/2009)   Triglycerides: 72  (07/07/2009)

## 2010-06-01 NOTE — Miscellaneous (Signed)
Summary: Changing prob    Clinical Lists Changes  Problems: Removed problem of ADVERSE DRUG REACTION (ICD-995.20) Removed problem of VAGINAL DISCHARGE (ICD-623.5) Removed problem of POLYURIA (ZOX-096.04) Removed problem of LOCALIZED SUPERFICIAL SWELLING MASS OR LUMP (ICD-782.2) Removed problem of PHYSICAL EXAMINATION (ICD-V70.0) Removed problem of HIP PAIN, LEFT (ICD-719.45) Removed problem of SCREENING FOR MALIGNANT NEOPLASM OF THE CERVIX (ICD-V76.2) Removed problem of WELL ADULT EXAM (ICD-V70.0)

## 2010-06-05 ENCOUNTER — Ambulatory Visit (INDEPENDENT_AMBULATORY_CARE_PROVIDER_SITE_OTHER): Payer: PRIVATE HEALTH INSURANCE | Admitting: Family Medicine

## 2010-06-05 ENCOUNTER — Encounter: Payer: Self-pay | Admitting: Family Medicine

## 2010-06-05 DIAGNOSIS — L03319 Cellulitis of trunk, unspecified: Secondary | ICD-10-CM | POA: Insufficient documentation

## 2010-06-05 DIAGNOSIS — L02219 Cutaneous abscess of trunk, unspecified: Secondary | ICD-10-CM

## 2010-06-15 NOTE — Assessment & Plan Note (Signed)
Summary: boils,df   Vital Signs:  Patient profile:   50 year old female Height:      60.25 inches Weight:      175 pounds BMI:     34.02 Pulse rate:   80 / minute BP sitting:   139 / 86  (left arm) Cuff size:   regular  Vitals Entered By: Tessie Fass CMA (June 05, 2010 2:07 PM) CC: boils Pain Assessment Patient in pain? no        Primary Care Provider:  Maelyn Berrey MD  CC:  boils.  History of Present Illness: 50 y/o F with history of boils is here for the same.  The last time she was treated for this was 09/2009 and 01/2010.  Both times she responded to Doxycycline x 1 wk.   BOILS: Some present x 2 wks.  Some present x 1 wk.  The first one appeared on her thighs two wks ago.  These were small and drained after patient opened them with her fingernails.  Next she got one on her R axilla.  This appeared about 1 wk ago.  She also has one on L hip.  A few days ago one appeared on suprapubic area.  The one on the suprapubic area is large and very tender and has started to drain today.    She denies fever, but states that she has felt warm intermittently for the past few days.  She has been taking Tylenol at night, which has helped with pain so that she can sleep.  No rash, abnormal bleeding, joint pain/swelling.    Current Medications (verified): 1)  Flonase 50 Mcg/act  Susp (Fluticasone Propionate) .Marland Kitchen.. 1 Spray Per Nostril Daily 2)  Claritin 10 Mg  Tabs (Loratadine) .Marland Kitchen.. 1 Tab By Mouth Daily 3)  Prilosec Otc 20 Mg  Tbec (Omeprazole Magnesium) .Marland Kitchen.. 1 Tab By Mouth Daily 4)  Womens Multivitamin Plus   Tabs (Multiple Vitamins-Minerals) .Marland Kitchen.. 1 Tab By Mouth Daily 5)  Climara Pro 0.045-0.015 Mg/day Ptwk (Estradiol-Levonorgestrel) .... Apply 1 Patch Per Week 6)  Black Cohost 7)  Vitamin D (Ergocalciferol) 50000 Unit Caps (Ergocalciferol) .Marland Kitchen.. 1 Cap By Mouth Once A Week For 8 Weeks, Then Take Over The Counter Vitamin D Supplement Daily  Allergies (verified): No Known Drug  Allergies  Review of Systems       per  hpi   Physical Exam  General:  Well-developed,well-nourished,in no acute distress; alert,appropriate and cooperative throughout examination. Vitals reviewed.  Skin:  R axilla with abscess 1/2 cm x 1 cm, no erythema, fluctulance, induration. +tenderness.   L hip with abscess 1cm x 2 cm, no erythem, fluctulance, induration.  Bilateral anterior thighs with 1/2cm x 1/2cm abscess that has drained and now with scaly healing.  Suprapubic area: 1cm x 2 cm abscess, red, indurated, warm, very tender to palpation. No edema.   Additional Exam:  I&D abscess suprapubic area: Patient given informed consent for I&D. She has no questions. Signed copy in chart. Patient placed in supine position.  Tried to shave hair, but to tender.  Skin cleaned with betadine x 3.   Hair on area shaved with razor.  Anesthesia given with Zylocaine 1%, 6cc, by 18 gauge needle.  Anesthesia achieved.  An 11-blade scaple was used to incise a horizontal line across abscess.  Fluid drained with gauze.  Area cleansed with alcohol pads.  Sterile gauze dressing made.  Post procedure instructions given.    Impression & Recommendations:  Problem # 1:  GROIN ABSCESS (ICD-682.2) Assessment New 1cm x 2cm abscess on suprapubic area was I&D.  Packed with sterile gauze and skin tape.  Post procedure instsructions given.  Rx for Doxycycline 100mg  two times a day x 7 days and Hydrocodone 5-325 #10 tabs given.    Other abscess on axilla, hip, thighs should improve with antibiotic (doxy).  Red flags given for rtc (red streaks, swelling, worsening pain, fever, etc).   The following medications were removed from the medication list:    Doxycycline Hyclate 100 Mg Caps (Doxycycline hyclate) .Marland Kitchen... 1 cap by mouth two times a day x 7 days Her updated medication list for this problem includes:    Doxycycline Hyclate 100 Mg Caps (Doxycycline hyclate) .Marland Kitchen... 1 by mouth two times a day x 7 days. take with  food.  Orders: [facility]- Est Level  3 ([address]) I&D Abcess, simple- [facility] (10060)  Complete Medication List: 1)  Flonase 50 Mcg/act Susp (Fluticasone propionate) .Marland Kitchen.. 1 spray per nostril daily 2)  Claritin 10 Mg Tabs (Loratadine) .Marland Kitchen.. 1 tab by mouth daily 3)  Prilosec Otc 20 Mg Tbec (Omeprazole magnesium) .Marland Kitchen.. 1 tab by mouth daily 4)  Womens Multivitamin Plus Tabs (Multiple vitamins-minerals) .Marland Kitchen.. 1 tab by mouth daily 5)  Climara Pro 0.045-0.015 Mg/day Ptwk (Estradiol-levonorgestrel) .... Apply 1 patch per week 6)  Black Cohost  7)  Vitamin D (ergocalciferol) 50000 Unit Caps (Ergocalciferol) .Marland Kitchen.. 1 cap by mouth once a week for 8 weeks, then take over the counter vitamin d supplement daily 8)  Doxycycline Hyclate 100 Mg Caps (Doxycycline hyclate) .Marland Kitchen.. 1 by mouth two times a day x 7 days. take with food. 9)  Norco 5-325 Mg Tabs (Hydrocodone-acetaminophen) .Marland Kitchen.. 1 tab by mouth every 4-6 hours as needed pain  Patient Instructions: 1)  Please schedule a follow-up appointment in 1 week if not better.  2)  Take doxycycline 100mg  two times a day x 7 days. 3)  If you have fever >102 that does not respond to tylenol or advid/motrin, call our office. 4)  Keep the area clean and dry for 24 hours.  You may change the packing as needed.  5)  If there is worse swelling or pain or drainage call our office. 6)  For pain I will call in Hydrocodone for you.  You can take 1 tab every 4-6 hours as needed.   Prescriptions: NORCO 5-325 MG TABS (HYDROCODONE-ACETAMINOPHEN) 1 tab by mouth every 4-6 hours as needed pain  #10 x 0   Entered and Authorized by:   Angeline Slim MD   Signed by:   Angeline Slim MD on 06/05/2010   Method used:   Telephoned to ...       [facility] [address] [phone]* (retail)       [address]       [place], [place]  [address]       Ph: [phone] or [phone]       Fax: [phone]   RxID:   [id number] DOXYCYCLINE HYCLATE 100 MG CAPS (DOXYCYCLINE HYCLATE) 1 by mouth two times a  day x 7 days. Take with food.  #14 x 0   Entered and Authorized by:   Angeline Slim MD   Signed by:   Angeline Slim MD on 06/05/2010   Method used:   Electronically to        [facility]. [address]. [phone]* (retail)       [address]  [place], [place]  [address]       Ph: [phone] or [phone]       Fax: [phone]   RxID:   [id number]    Orders Added: 1)  [facility]- Est Level  3 [[address]] 2)  I&D Abcess, simple- [facility] [10060]

## 2010-09-12 ENCOUNTER — Ambulatory Visit (INDEPENDENT_AMBULATORY_CARE_PROVIDER_SITE_OTHER): Payer: PRIVATE HEALTH INSURANCE | Admitting: Family Medicine

## 2010-09-12 ENCOUNTER — Encounter: Payer: Self-pay | Admitting: Family Medicine

## 2010-09-12 VITALS — BP 131/81 | HR 80 | Temp 98.2°F | Ht 61.0 in | Wt 179.0 lb

## 2010-09-12 DIAGNOSIS — N920 Excessive and frequent menstruation with regular cycle: Secondary | ICD-10-CM

## 2010-09-12 DIAGNOSIS — N939 Abnormal uterine and vaginal bleeding, unspecified: Secondary | ICD-10-CM

## 2010-09-12 LAB — BASIC METABOLIC PANEL
CO2: 23 mEq/L (ref 19–32)
Chloride: 106 mEq/L (ref 96–112)
Glucose, Bld: 102 mg/dL — ABNORMAL HIGH (ref 70–99)

## 2010-09-12 MED ORDER — MEDROXYPROGESTERONE ACETATE 10 MG PO TABS: ORAL_TABLET | ORAL | Status: DC

## 2010-09-12 MED ORDER — IBUPROFEN 200 MG PO TABS
400.0000 mg | ORAL_TABLET | Freq: Once | ORAL | Status: AC
  Administered 2010-09-12: 400 mg via ORAL

## 2010-09-12 NOTE — Patient Instructions (Signed)
Please call us when you have stopped bleeding and we will schedule an MRI for you. To stop bleeding: take provera 2 tabs three times a day for 7 days, then 2 tabs daily for 3 weeks.

## 2010-09-12 NOTE — Assessment & Plan Note (Addendum)
Pt is likely perimenopausal with irregular menstrual cycles.  Pt was evaluated by GYN for AUB/heavy menses thought to be 2/2 fibroids.  On 06/15/10 she had Abd and TVUS that showed inhomogenous myometrium which could represent adenomyositis, with focal adenomyoma vs fibroid.  MRI of pelvis was recommended for further evaluation.   She did not follow up with Dr Tamela Oddi so this was not done.  Today she is having heavy bleeding with having to change pads 9x today already.  Pt is soaking through her thickest pads and it is making difficult for her to perform her work.  She is also having abd cramping.  We discussed different option treatment and pt has decided to take Provera to stop bleeding.  Provera 20mg  tid x 7 days then 20mg  daily x 3 wks.   She will call to let us know when the bleeding has stopped and we will schedule an appt for her to get a pelvic MRI.  We will get Bmet today as she will need to have contrast for the MRI.   Her Hb today is 10.6 and pt is asymptomatic besides above complaints.  Will monitor.  No transfusion criteria met.

## 2010-09-12 NOTE — Progress Notes (Signed)
  Subjective:    Patient ID: Laurie Herrera, female    DOB: [birth year], 50 y.o.   MRN: [id number]  HPI AUB: She is reporting irregular menstrual cycles.  Feb: may not have had a menstrual cycle March:may not have had a menstrual cycle April: Light bleeding but dark blood. Spotted for 7-10 days. This month: menstrual cycle started yesterday, lots of abd cramping. She used heating pad for back and abd. She has changed pads 9 times today.  Took ibuprofen 400mg  at 10AM which helped cramping. Pain started again at 1pm.    Review of Systems No dyspnea, lightheadedned, dizziness, syncope, near-syncope, fatigue.     Objective:   Physical Exam  Constitutional: She appears well-developed and well-nourished. No distress.  Genitourinary: There is no tenderness or lesion on the right labia. There is no tenderness or lesion on the left labia. Cervix exhibits no motion tenderness. Right adnexum displays no mass and no tenderness. Left adnexum displays no mass and no tenderness. There is bleeding around the vagina. No erythema or tenderness around the vagina. No vaginal discharge found.  Lymphadenopathy:       Right: No inguinal adenopathy present.       Left: No inguinal adenopathy present.  +copious amt of red blood from vaginal vault.         Assessment & Plan:

## 2010-09-26 ENCOUNTER — Telehealth: Payer: Self-pay | Admitting: Family Medicine

## 2010-09-26 NOTE — Telephone Encounter (Signed)
Called pt. Advised that per Dr.Ta's last notes, we could schedule MRI of pelvis. Pt said, that she rather see Dr.Ta again. Pt reports, that bleeding has decreased from using 6 pads daily, now using 3 pads daily. She will schedule appt with Dr.Ta .Laurie Herrera

## 2010-09-26 NOTE — Telephone Encounter (Signed)
Is on her 16th day of her period and doesn't think the medicine is working - wants to know what she should do now?

## 2010-09-27 ENCOUNTER — Encounter: Payer: Self-pay | Admitting: Family Medicine

## 2010-09-27 ENCOUNTER — Ambulatory Visit (INDEPENDENT_AMBULATORY_CARE_PROVIDER_SITE_OTHER): Payer: PRIVATE HEALTH INSURANCE | Admitting: Family Medicine

## 2010-09-27 VITALS — BP 132/83 | HR 74 | Temp 98.3°F | Ht 61.0 in | Wt 174.0 lb

## 2010-09-27 DIAGNOSIS — N939 Abnormal uterine and vaginal bleeding, unspecified: Secondary | ICD-10-CM

## 2010-09-27 DIAGNOSIS — N926 Irregular menstruation, unspecified: Secondary | ICD-10-CM

## 2010-09-27 LAB — POCT HEMOGLOBIN: Hemoglobin: 11.1

## 2010-09-27 MED ORDER — MEDROXYPROGESTERONE ACETATE 10 MG PO TABS: ORAL_TABLET | ORAL | Status: DC

## 2010-09-27 NOTE — Patient Instructions (Signed)
I'm sorry that you are still menstruating.  Please restart the Provera.  1st week: 2 pills three times each day for 7 days.  2nd week on: 2 pills once a day.   Call me in one and a half week to let me know how you are doing.

## 2010-09-27 NOTE — Progress Notes (Signed)
  Subjective:    Patient ID: Laurie Herrera, female    DOB: 08/31/1960, 50 y.o.   MRN: [id number]  HPI Pt is here today for f/u of DUB.  She was seen 3 wks ago and at that time she was Rx Provera to stop her bleeding.  During the last weekend she had 4 days off for the holidays, she was wearing panty liners.  Since being back to work on 5/29. Changing pads 4-5 times per day now and she is using heavy pads.  She is waiting for menstrual cycle to stop so that she can get an MRI.     Pt is likely perimenopausal with irregular menstrual cycles. Pt was evaluated by GYN for AUB/heavy menses thought to be 2/2 fibroids. On 06/15/10 she had Abd and TVUS that showed inhomogenous myometrium which could represent adenomyositis, with focal adenomyoma vs fibroid. MRI of pelvis was recommended for further evaluation. She did not follow up with Dr Tamela Oddi so this was not done. Today she is having heavy bleeding with having to change pads 9x today already. Pt is soaking through her thickest pads and it is making difficult for her to perform her work. She is also having abd cramping. We discussed different option treatment and pt has decided to take Provera to stop bleeding. Provera 20mg  tid x 7 days then 20mg  daily x 3 wks.    Review of Systems No dyspnea, fatigue, abd pain, pelvic pain, fever/chils    Objective:   Physical Exam GEN: nad, aox3       Assessment & Plan:

## 2010-09-28 NOTE — Assessment & Plan Note (Addendum)
Pt is likely perimenopausal with irregular menstrual cycles. Pt was evaluated by GYN for AUB/heavy menses thought to be 2/2 fibroids. On 06/15/10 she had Abd and TVUS that showed inhomogenous myometrium which could represent adenomyositis, with focal adenomyoma vs fibroid. MRI of pelvis was recommended for further evaluation. She did not follow up with Dr Tamela Oddi so this was not done.  Today she returns because she is having heaving bleeding again after trying Provera as directed 3 wks ago. We discussed different option treatment and pt has decided to take Provera again to stop bleeding. Provera 20mg  tid x 7 days then 20mg  daily x 3 wks.  We checked Hb today and >11.  Pt to call our clinic when she has stopped bleeding so that we can schedule an MRI for her.

## 2010-10-01 ENCOUNTER — Emergency Department (HOSPITAL_COMMUNITY)
Admission: EM | Admit: 2010-10-01 | Discharge: 2010-10-01 | Disposition: A | Discharge status: Home / Self Care | Payer: PRIVATE HEALTH INSURANCE | Attending: Emergency Medicine | Admitting: Emergency Medicine

## 2010-10-01 DIAGNOSIS — R112 Nausea with vomiting, unspecified: Secondary | ICD-10-CM | POA: Insufficient documentation

## 2010-10-01 DIAGNOSIS — R109 Unspecified abdominal pain: Secondary | ICD-10-CM | POA: Insufficient documentation

## 2010-10-01 DIAGNOSIS — N898 Other specified noninflammatory disorders of vagina: Secondary | ICD-10-CM | POA: Insufficient documentation

## 2010-10-01 LAB — CBC
HCT: 31.1 % — ABNORMAL LOW (ref 36.0–46.0)
Hemoglobin: 10.1 g/dL — ABNORMAL LOW (ref 12.0–15.0)
MCHC: 32.5 g/dL (ref 30.0–36.0)
Platelets: 312 10*3/uL (ref 150–400)
RBC: 4.12 MIL/uL (ref 3.87–5.11)
RDW: 14.6 % (ref 11.5–15.5)
WBC: 10.3 10*3/uL (ref 4.0–10.5)

## 2010-10-01 LAB — DIFFERENTIAL
Basophils Relative: 0 % (ref 0–1)
Eosinophils Absolute: 0.1 10*3/uL (ref 0.0–0.7)
Eosinophils Relative: 1 % (ref 0–5)
Lymphocytes Relative: 33 % (ref 12–46)
Lymphs Abs: 3.4 10*3/uL (ref 0.7–4.0)
Monocytes Absolute: 0.4 10*3/uL (ref 0.1–1.0)
Neutrophils Relative %: 62 % (ref 43–77)

## 2010-10-01 LAB — URINALYSIS, ROUTINE W REFLEX MICROSCOPIC
Bilirubin Urine: NEGATIVE
Nitrite: NEGATIVE
Protein, ur: NEGATIVE mg/dL
pH: 5.5 (ref 5.0–8.0)

## 2010-10-01 LAB — COMPREHENSIVE METABOLIC PANEL
AST: 12 U/L (ref 0–37)
Albumin: 3.5 g/dL (ref 3.5–5.2)
BUN: 11 mg/dL (ref 6–23)
CO2: 21 mEq/L (ref 19–32)
Calcium: 9.3 mg/dL (ref 8.4–10.5)
Creatinine, Ser: 0.74 mg/dL (ref 0.4–1.2)
GFR calc Af Amer: >60 mL/min (ref >60)
Potassium: 3.6 mEq/L (ref 3.5–5.1)

## 2010-10-01 LAB — LIPASE, BLOOD: Lipase: 22 U/L (ref 11–59)

## 2010-10-02 ENCOUNTER — Telehealth: Payer: Self-pay | Admitting: Family Medicine

## 2010-10-02 NOTE — Telephone Encounter (Signed)
Pt says she has been coming in for heavy menses, vomitting, pain etc. Went to the hospital over the weekend for these symptoms & was given something for pain & nausea, pt says Dr. Janalyn Harder was suppose to set her up to have an mri, pt wants to know the status of this?

## 2010-10-02 NOTE — Telephone Encounter (Signed)
Sched. MRI of pelvis at the [address] ([facility]) for 10-09-10 at 11:30am. Called pt and informed of the appt. Advised to arrive at 11 am for the procedure. Pt agreed.  Fwd. To Dr.Ta for info .Arlyss Repress

## 2010-10-03 LAB — URINE CULTURE
Colony Count: NO GROWTH
Culture: NO GROWTH

## 2010-10-04 ENCOUNTER — Telehealth: Payer: Self-pay | Admitting: *Deleted

## 2010-10-04 NOTE — Telephone Encounter (Signed)
[facility] called today requesting PA # for patient's MRI of pelvis w and w/o contrast.  Called pt's insurance- [employer] who advised that I go to their website and print the PA form, complete and fax back.  Faxed completed PA form to [place] today.  Will await approval number.

## 2010-10-09 ENCOUNTER — Ambulatory Visit
Admission: RE | Admit: 2010-10-09 | Discharge: 2010-10-09 | Disposition: A | Discharge status: Home / Self Care | Payer: PRIVATE HEALTH INSURANCE | Source: Ambulatory Visit | Attending: Family Medicine | Admitting: Family Medicine

## 2010-10-09 DIAGNOSIS — N939 Abnormal uterine and vaginal bleeding, unspecified: Secondary | ICD-10-CM

## 2010-10-09 MED ORDER — GADOBENATE DIMEGLUMINE 529 MG/ML IV SOLN
16.0000 mL | Freq: Once | INTRAVENOUS | Status: AC | PRN
  Administered 2010-10-09: 16 mL via INTRAVENOUS

## 2010-10-12 ENCOUNTER — Telehealth: Payer: Self-pay | Admitting: Family Medicine

## 2010-10-12 NOTE — Telephone Encounter (Signed)
Forward to Ta, please explain results to patient.Laurie Herrera, Rodena Medin

## 2010-10-12 NOTE — Telephone Encounter (Signed)
Please call pt with results from  Monday's MRI

## 2011-01-19 LAB — POCT URINALYSIS DIP (DEVICE)
Glucose, UA: NEGATIVE
Nitrite: NEGATIVE
Operator id: 239701
Urobilinogen, UA: 0.2

## 2011-05-08 ENCOUNTER — Ambulatory Visit (INDEPENDENT_AMBULATORY_CARE_PROVIDER_SITE_OTHER): Payer: PRIVATE HEALTH INSURANCE | Admitting: Emergency Medicine

## 2011-05-08 ENCOUNTER — Encounter: Payer: Self-pay | Admitting: Emergency Medicine

## 2011-05-08 DIAGNOSIS — L732 Hidradenitis suppurativa: Secondary | ICD-10-CM

## 2011-05-08 DIAGNOSIS — J040 Acute laryngitis: Secondary | ICD-10-CM | POA: Insufficient documentation

## 2011-05-08 MED ORDER — DOXYCYCLINE HYCLATE 100 MG PO TABS: 100.0000 mg | ORAL_TABLET | Freq: Two times a day (BID) | ORAL | Status: AC

## 2011-05-08 NOTE — Patient Instructions (Addendum)
It was nice to see you today, I'm sorry you aren't feeling well.  I think your throat symptoms are due to a virus, and it sounds like you're on the mend.  Your cough may take several weeks to completely go away.  I am giving you a 10 day course of antibiotics for the boil under your arm - this will likely help the cough as it also has anti-inflammatory effects.  At this time, we do not need to get a chest x-ray.  If your symptoms to not get better or start getting worse, it will be something to consider at your appointment of the 17th.

## 2011-05-08 NOTE — Assessment & Plan Note (Signed)
History and exam consistent with hidradenitis.  Likely has component of obesity that is contributing.  Will give 10-day course of doxycycline.

## 2011-05-08 NOTE — Assessment & Plan Note (Addendum)
Improving.  Discussed that post-infectious cough can linger for several weeks.  Antibiotics given for hidradenitis may help improve the cough due to anti-inflammatory effects.  Discussed chest x-ray, and patient agreeable with not obtaining one today.  With no fever, normal lung exam, and normal vital signs, a chest x-ray is not indicated.  Her upper back pain is likely related to the cough as it is MSK in nature.

## 2011-05-08 NOTE — Progress Notes (Signed)
  Subjective:    Patient ID: Laurie Herrera, female    DOB: 11/10/1960, 51 y.o.   MRN: [id number]  HPI Laurie Herrera is here today for an acute visit for laryngitis and boil in right axilla.  1. Laryngitis: She states that she has been sick for the last 2 weeks.  Initially, she was unable to speak above a whisper, but that has improved to just hoarseness today.  This has been accompanied by a cough that is occasionally productive of yellowish sputum, mild nasal congestion, some fatigue, and possible chills.  Has also noticed some upper back pain at night over the last several days. Denies fever, body aches, n/v/d.  2. Right axillary boil: States has history of underarm boils, has also had one in her groin.  Usually will enlarge and eventually come to a head.  Antibiotics have been helpful in the past, she states, when given earlier.  This boil has just started to come up in the last week, some tenderness.  Would like antibiotics.   Review of Systems See HPI, otherwise negative    Objective:   Physical Exam Vitals: reviewed HEENT: [place]/AT, sclera white, MMM, no pharyngeal erythema or exudate Neck: no cervical LAD Pulm: CTAB, no wheezes, rales Back: tender to palpation in left upper back Axilla: small, superficial, mildly tender nodule in right axilla; no surrounding erythema     Assessment & Plan:

## 2011-05-17 ENCOUNTER — Other Ambulatory Visit (HOSPITAL_COMMUNITY)
Admission: RE | Admit: 2011-05-17 | Discharge: 2011-05-17 | Disposition: A | Discharge status: Home / Self Care | Payer: PRIVATE HEALTH INSURANCE | Source: Ambulatory Visit | Attending: Family Medicine | Admitting: Family Medicine

## 2011-05-17 ENCOUNTER — Encounter: Payer: Self-pay | Admitting: Emergency Medicine

## 2011-05-17 ENCOUNTER — Ambulatory Visit (INDEPENDENT_AMBULATORY_CARE_PROVIDER_SITE_OTHER): Payer: PRIVATE HEALTH INSURANCE | Admitting: Emergency Medicine

## 2011-05-17 VITALS — BP 132/83 | HR 80 | Ht 61.0 in | Wt 183.0 lb

## 2011-05-17 DIAGNOSIS — Z01419 Encounter for gynecological examination (general) (routine) without abnormal findings: Secondary | ICD-10-CM | POA: Insufficient documentation

## 2011-05-17 DIAGNOSIS — Z124 Encounter for screening for malignant neoplasm of cervix: Secondary | ICD-10-CM

## 2011-05-17 DIAGNOSIS — Z0000 Encounter for general adult medical examination without abnormal findings: Secondary | ICD-10-CM | POA: Insufficient documentation

## 2011-05-17 NOTE — Progress Notes (Signed)
  Subjective:    Patient ID: Laurie Herrera, female    DOB: [birth year], 51 y.o.   MRN: [id number]  HPI Ms. Adamson is here today for her well woman exam.  She has no acute concerns.  LMP was 01/2011.  Does have hot flashes that interfere with sleep.  Will have some dark blood vaginal discharge during the time she should have her period.  No vaginal itching or dryness.   Review of Systems Negative except as in HPI.    Objective:   Physical Exam Vitals: reviewed  Gen: alert, cooperative, NAD HEENT: AT/[place], sclera white Pelvic: external genitalia normal; vaginal mucosa normal; cervix normal; bimanual exam normal, no cervical motion tenderness      Assessment & Plan:

## 2011-05-17 NOTE — Assessment & Plan Note (Signed)
Doing well.  Pap done today and information provided on screening mammogram and colonoscopy.  Will set up an appointment to discuss obesity and hot flashes.

## 2011-05-17 NOTE — Patient Instructions (Signed)
It was nice seeing you again!  Please set up a mammogram and a colonoscopy in the next few months.  After that we'll be all caught up for a while.  I will see you back in a month or so to discuss hot flashes and weight.

## 2011-05-21 ENCOUNTER — Encounter: Payer: Self-pay | Admitting: Emergency Medicine

## 2011-09-05 ENCOUNTER — Encounter: Payer: Self-pay | Admitting: Gastroenterology

## 2011-09-05 ENCOUNTER — Telehealth: Payer: Self-pay | Admitting: Emergency Medicine

## 2011-09-05 ENCOUNTER — Other Ambulatory Visit: Payer: Self-pay | Admitting: Family Medicine

## 2011-09-05 DIAGNOSIS — Z1231 Encounter for screening mammogram for malignant neoplasm of breast: Secondary | ICD-10-CM

## 2011-09-05 NOTE — Telephone Encounter (Signed)
Waiting for call back. Please ask pt: She needs to find out through her Insurance [employer] geologist.Damita Lack) who is in her network. She also can schedule her appt, once she finds out, which GI doctor is in her network. Lorenda Hatchet, Renato Battles

## 2011-09-05 NOTE — Telephone Encounter (Signed)
Pt called back and was given information.

## 2011-09-05 NOTE — Telephone Encounter (Signed)
Is asking to be referred for her colonoscopy.  She was supposed to set that up before now, but is asking for [place] to set it up for her.

## 2011-09-17 ENCOUNTER — Ambulatory Visit
Admission: RE | Admit: 2011-09-17 | Discharge: 2011-09-17 | Disposition: A | Discharge status: Home / Self Care | Payer: PRIVATE HEALTH INSURANCE | Source: Ambulatory Visit | Attending: Family Medicine | Admitting: Family Medicine

## 2011-09-17 DIAGNOSIS — Z1231 Encounter for screening mammogram for malignant neoplasm of breast: Secondary | ICD-10-CM

## 2011-09-19 ENCOUNTER — Other Ambulatory Visit: Payer: Self-pay | Admitting: Family Medicine

## 2011-09-19 DIAGNOSIS — R928 Other abnormal and inconclusive findings on diagnostic imaging of breast: Secondary | ICD-10-CM

## 2011-09-25 ENCOUNTER — Ambulatory Visit (AMBULATORY_SURGERY_CENTER): Payer: PRIVATE HEALTH INSURANCE

## 2011-09-25 VITALS — Ht 60.0 in | Wt 190.8 lb

## 2011-09-25 DIAGNOSIS — Z1211 Encounter for screening for malignant neoplasm of colon: Secondary | ICD-10-CM

## 2011-09-25 MED ORDER — PEG-KCL-NACL-NASULF-NA ASC-C 100 G PO SOLR: 1.0000 | Freq: Once | ORAL | Status: AC

## 2011-10-02 ENCOUNTER — Ambulatory Visit
Admission: RE | Admit: 2011-10-02 | Discharge: 2011-10-02 | Disposition: A | Discharge status: Home / Self Care | Payer: PRIVATE HEALTH INSURANCE | Source: Ambulatory Visit | Attending: Family Medicine | Admitting: Family Medicine

## 2011-10-02 DIAGNOSIS — R928 Other abnormal and inconclusive findings on diagnostic imaging of breast: Secondary | ICD-10-CM

## 2011-10-04 ENCOUNTER — Telehealth: Payer: Self-pay | Admitting: Emergency Medicine

## 2011-10-04 NOTE — Telephone Encounter (Signed)
Left voicemail informing patient of probably benign breast findings on ultrasound and diagnostic mammogram.  Informed her that she will need repeat studies in 6 months.  Can call the clinic if she has any questions.

## 2011-10-09 ENCOUNTER — Ambulatory Visit (AMBULATORY_SURGERY_CENTER): Payer: PRIVATE HEALTH INSURANCE | Admitting: Gastroenterology

## 2011-10-09 ENCOUNTER — Encounter: Payer: Self-pay | Admitting: Gastroenterology

## 2011-10-09 VITALS — BP 127/68 | HR 70 | Temp 97.6°F | Resp 13 | Ht 60.0 in | Wt 190.0 lb

## 2011-10-09 DIAGNOSIS — D126 Benign neoplasm of colon, unspecified: Secondary | ICD-10-CM

## 2011-10-09 DIAGNOSIS — Z1211 Encounter for screening for malignant neoplasm of colon: Secondary | ICD-10-CM

## 2011-10-09 MED ORDER — SODIUM CHLORIDE 0.9 % IV SOLN: 500.0000 mL | INTRAVENOUS | Status: DC

## 2011-10-09 NOTE — Patient Instructions (Signed)
YOU HAD AN ENDOSCOPIC PROCEDURE TODAY AT THE [facility]: Refer to the procedure report that was given to you for any specific questions about what was found during the examination.  If the procedure report does not answer your questions, please call your gastroenterologist to clarify.  If you requested that your care partner not be given the details of your procedure findings, then the procedure report has been included in a sealed envelope for you to review at your convenience later.  YOU SHOULD EXPECT: Some feelings of bloating in the abdomen. Passage of more gas than usual.  Walking can help get rid of the air that was put into your GI tract during the procedure and reduce the bloating. If you had a lower endoscopy (such as a colonoscopy or flexible sigmoidoscopy) you may notice spotting of blood in your stool or on the toilet paper. If you underwent a bowel prep for your procedure, then you may not have a normal bowel movement for a few days.  DIET: Your first meal following the procedure should be a light meal and then it is ok to progress to your normal diet.  A half-sandwich or bowl of soup is an example of a good first meal.  Heavy or fried foods are harder to digest and may make you feel nauseous or bloated.  Likewise meals heavy in dairy and vegetables can cause extra gas to form and this can also increase the bloating.  Drink plenty of fluids but you should avoid alcoholic beverages for 24 hours.  ACTIVITY: Your care partner should take you home directly after the procedure.  You should plan to take it easy, moving slowly for the rest of the day.  You can resume normal activity the day after the procedure however you should NOT DRIVE or use heavy machinery for 24 hours (because of the sedation medicines used during the test).    SYMPTOMS TO REPORT IMMEDIATELY: A gastroenterologist can be reached at any hour.  During normal business hours, 8:30 AM to 5:00 PM Monday through Friday,  call [phone].  After hours and on weekends, please call the GI answering service at [phone] who will take a message and have the physician on call contact you.   Following lower endoscopy (colonoscopy or flexible sigmoidoscopy):  Excessive amounts of blood in the stool  Significant tenderness or worsening of abdominal pains  Swelling of the abdomen that is new, acute  Fever of 100F or higher    FOLLOW UP: If any biopsies were taken you will be contacted by phone or by letter within the next 1-3 weeks.  Call your gastroenterologist if you have not heard about the biopsies in 3 weeks.  Our staff will call the home number listed on your records the next business day following your procedure to check on you and address any questions or concerns that you may have at that time regarding the information given to you following your procedure. This is a courtesy call and so if there is no answer at the home number and we have not heard from you through the emergency physician on call, we will assume that you have returned to your regular daily activities without incident.  SIGNATURES/CONFIDENTIALITY: You and/or your care partner have signed paperwork which will be entered into your electronic medical record.  These signatures attest to the fact that that the information above on your After Visit Summary has been reviewed and is understood.  Full responsibility of the confidentiality   of this discharge information lies with you and/or your care-partner.     

## 2011-10-09 NOTE — Progress Notes (Signed)
Patient did not have preoperative order for IV antibiotic SSI prophylaxis. (G8918)  Patient did not experience any of the following events: a burn prior to discharge; a fall within the facility; wrong site/side/patient/procedure/implant event; or a hospital transfer or hospital admission upon discharge from the facility. (G8907)  

## 2011-10-09 NOTE — Op Note (Signed)
[facility] [address] [place], [place]  [address]  COLONOSCOPY PROCEDURE REPORT  PATIENT:  Laurie Herrera, Laurie Herrera  MR#:  [id number] BIRTHDATE:  10/25/1960, 51 yrs. old  GENDER:  female ENDOSCOPIST:  Barbette Hair. Arlyce Dice, MD REF. BY: PROCEDURE DATE:  10/09/2011 PROCEDURE:  Colon with cold biopsy polypectomy ASA CLASS:  Class I INDICATIONS:  Routine Risk Screening MEDICATIONS:   MAC sedation, administered by CRNA propofol 200mg IV  DESCRIPTION OF PROCEDURE:   After the risks benefits and alternatives of the procedure were thoroughly explained, informed consent was obtained.  Digital rectal exam was performed and revealed no abnormalities.   The LB [id number] endoscope was introduced through the anus and advanced to the cecum, which was identified by both the appendix and ileocecal valve, without limitations.  The quality of the prep was excellent, using MoviPrep.  The instrument was then slowly withdrawn as the colon was fully examined. <<PROCEDUREIMAGES>>  FINDINGS:  A sessile polyp was found in the proximal transverse colon. It was 2 mm in size. The polyp was removed using cold biopsy forceps (see image5).  This was otherwise a normal examination of the colon (see image3, image4, and image6). Retroflexed views in the rectum revealed no abnormalities.    The time to cecum =  1) 3.75  minutes. The scope was then withdrawn in 1) 8.25  minutes from the cecum and the procedure completed. COMPLICATIONS:  None ENDOSCOPIC IMPRESSION: 1) 2 mm sessile polyp in the proximal transverse colon 2) Otherwise normal examination RECOMMENDATIONS: 1) If the polyp(s) removed today are proven to be adenomatous (pre-cancerous) polyps, you will need a repeat colonoscopy in 5 years. Otherwise you should continue to follow colorectal cancer screening guidelines for "routine risk" patients with colonoscopy in 10 years. You will receive a letter within 1-2 weeks with the results of your  biopsy as well as final recommendations. Please call my office if you have not received a letter after 3 weeks. REPEAT EXAM:   You will receive a letter from Dr. Arlyce Dice in 1-2 weeks, after reviewing the final pathology, with followup recommendations.  ______________________________ Barbette Hair Arlyce Dice, MD  CC:  Despina Hick MD  n. Rosalie DoctorBarbette Hair. Geovannie Vilar at 10/09/2011 02:06 PM  Verlan Friends, [phone]

## 2011-10-10 ENCOUNTER — Telehealth: Payer: Self-pay

## 2011-10-10 NOTE — Telephone Encounter (Signed)
Left message on answering machine. 

## 2011-10-15 ENCOUNTER — Encounter: Payer: Self-pay | Admitting: Gastroenterology

## 2011-10-29 ENCOUNTER — Encounter: Payer: Self-pay | Admitting: Family Medicine

## 2011-10-29 ENCOUNTER — Ambulatory Visit (INDEPENDENT_AMBULATORY_CARE_PROVIDER_SITE_OTHER): Payer: PRIVATE HEALTH INSURANCE | Admitting: Family Medicine

## 2011-10-29 VITALS — BP 140/83 | HR 85 | Temp 98.2°F | Ht 60.0 in | Wt 178.3 lb

## 2011-10-29 DIAGNOSIS — L03115 Cellulitis of right lower limb: Secondary | ICD-10-CM | POA: Insufficient documentation

## 2011-10-29 DIAGNOSIS — L03119 Cellulitis of unspecified part of limb: Secondary | ICD-10-CM

## 2011-10-29 MED ORDER — TRAMADOL HCL 50 MG PO TABS: 50.0000 mg | ORAL_TABLET | Freq: Three times a day (TID) | ORAL | Status: AC | PRN

## 2011-10-29 MED ORDER — DOXYCYCLINE HYCLATE 100 MG PO TABS: 100.0000 mg | ORAL_TABLET | Freq: Two times a day (BID) | ORAL | Status: AC

## 2011-10-29 NOTE — Progress Notes (Signed)
  Subjective:    Patient ID: Laurie Herrera, female    DOB: [birth year], 51 y.o.   MRN: [id number]  HPI  Laurie Herrera comes in with erythema and redness on her left leg that started Saturday morning when she woke up.  She thinks she was bitten by a spider but did not see a spider.  She says since Saturday morning, a head formed in the middle of the spot and drained a little puss.  However, the redness got larger and the pain has gotten worse.  She says it throbs at night time and makes it hard to sleep.   She denies fevers/chills/fatigue.   Review of Systems Pertinent items in HPI.     Objective:   Physical Exam BP 140/83  Pulse 85  Temp 98.2 F (36.8 C) (Oral)  Ht 5' (1.524 m)  Wt 178 lb 4.8 oz (80.876 kg)  BMI 34.82 kg/m2 General appearance: alert, cooperative and no distress Skin: Right lower leg with 3x4 cm erythematous, warm lesion, with scab in center.  Slight edema of the leg, no pain with movement of foot or toes 2+ pulses in LE bilaterally.        Assessment & Plan:

## 2011-10-29 NOTE — Assessment & Plan Note (Signed)
Not sure if this was a spider bite vs. Other insect bite that broke skin- but consistent with cellulitis.  Rx doxy x 7 days.  Tramadol for pain.  Hand out on cellulitis given, red flags to return reviewed.

## 2011-10-29 NOTE — Patient Instructions (Signed)
Cellulitis Cellulitis is an infection of the skin around the nose, cheeks, ears, legs or on the buttocks. Erysipelas usually occurs in young children and the elderly. It is caused by entry of bacteria (strep or streptococcus) through breaks in the skin. The breaks in the skin may be caused by:  A recent injury to the skin through trauma.   Swelling of the skin due to radiation therapy.   Current skin infection (such as athelete's foot or impetigo).   Accumulation of fluid through:   Poor circulation.   Heart failure.   Liver disease.   Past surgery to remove lymph nodes.   Being overweight.  This condition may start suddenly with:  A red rash raised above the level of surrounding skin.   Local heat.   Swelling.  The rash may be preceded by:  Chills.   High fever.   Headache.   Vomiting.   Joint pains.  It usually requires 10 days of antibiotic treatment to make sure the infection is gone. Sometimes the first doses are given by injection. If your leg is infected, especially when blisters are present, you may require hospitalization and intravenous (IV) antibiotics. Get plenty of rest and drink extra fluids for the next few days. If erysipelas involves a limb, keep the affected limb elevated. Only take over-the-counter or prescription medicines for pain, discomfort, or fever as directed by your caregiver. Call your caregiver if your rash and other symptoms worsen or do not improve over the next 1-2 days.  SEEK IMMEDIATE MEDICAL CARE IF:  You develop a severe headache, weakness, dehydration, or a high fever.   You develop repeated vomiting, chest or abdominal pain.   You develop an allergic reaction to your medicine such as hives, itching, swelling, breathing problems, or fainting.  Document Released: 05/24/2004 Document Revised: 04/05/2011 Document Reviewed: 06/04/2008 [facility] Patient Information 2012 [place], [place].

## 2011-11-19 ENCOUNTER — Ambulatory Visit: Payer: PRIVATE HEALTH INSURANCE | Admitting: Family Medicine

## 2012-07-01 ENCOUNTER — Other Ambulatory Visit: Payer: Self-pay | Admitting: Family Medicine

## 2012-07-07 ENCOUNTER — Other Ambulatory Visit: Payer: Self-pay | Admitting: *Deleted

## 2012-07-07 ENCOUNTER — Other Ambulatory Visit: Payer: Self-pay | Admitting: Internal Medicine

## 2012-07-07 ENCOUNTER — Other Ambulatory Visit: Payer: Self-pay | Admitting: Family Medicine

## 2012-07-10 ENCOUNTER — Other Ambulatory Visit: Payer: Self-pay | Admitting: Internal Medicine

## 2012-07-10 ENCOUNTER — Other Ambulatory Visit: Payer: Self-pay | Admitting: Family Medicine

## 2012-07-11 ENCOUNTER — Other Ambulatory Visit: Payer: Self-pay | Admitting: Family Medicine

## 2012-07-11 ENCOUNTER — Other Ambulatory Visit: Payer: Self-pay | Admitting: Emergency Medicine

## 2012-07-11 ENCOUNTER — Other Ambulatory Visit: Payer: Self-pay | Admitting: Internal Medicine

## 2012-07-14 ENCOUNTER — Ambulatory Visit
Admission: RE | Admit: 2012-07-14 | Discharge: 2012-07-14 | Disposition: A | Discharge status: Home / Self Care | Payer: PRIVATE HEALTH INSURANCE | Source: Ambulatory Visit | Attending: Family Medicine | Admitting: Family Medicine

## 2012-08-01 ENCOUNTER — Ambulatory Visit (INDEPENDENT_AMBULATORY_CARE_PROVIDER_SITE_OTHER): Payer: PRIVATE HEALTH INSURANCE | Admitting: Emergency Medicine

## 2012-08-01 ENCOUNTER — Encounter: Payer: Self-pay | Admitting: Emergency Medicine

## 2012-08-01 VITALS — BP 129/85 | HR 69 | Ht 60.0 in | Wt 175.0 lb

## 2012-08-01 DIAGNOSIS — Z23 Encounter for immunization: Secondary | ICD-10-CM

## 2012-08-01 DIAGNOSIS — L503 Dermatographic urticaria: Secondary | ICD-10-CM

## 2012-08-01 DIAGNOSIS — N951 Menopausal and female climacteric states: Secondary | ICD-10-CM

## 2012-08-01 DIAGNOSIS — Z0000 Encounter for general adult medical examination without abnormal findings: Secondary | ICD-10-CM

## 2012-08-01 LAB — BASIC METABOLIC PANEL
BUN: 17 mg/dL (ref 6–23)
CO2: 27 mEq/L (ref 19–32)
Chloride: 107 mEq/L (ref 96–112)
Creat: 0.79 mg/dL (ref 0.50–1.10)
Glucose, Bld: 74 mg/dL (ref 70–99)

## 2012-08-01 LAB — CBC
Hemoglobin: 12.5 g/dL (ref 12.0–15.0)
MCH: 25.8 pg — ABNORMAL LOW (ref 26.0–34.0)
MCHC: 32.9 g/dL (ref 30.0–36.0)

## 2012-08-01 MED ORDER — LORATADINE 10 MG PO TABS: 10.0000 mg | ORAL_TABLET | Freq: Every day | ORAL | Status: AC

## 2012-08-01 MED ORDER — GABAPENTIN 300 MG PO CAPS: ORAL_CAPSULE | ORAL | Status: DC

## 2012-08-01 NOTE — Patient Instructions (Addendum)
It was nice to see you! Everything looks good. Take gabapentin 300mg  at bedtime for one week, then increase to 600mg  if the hot flashes continue. Take loratadine 10mg  daily to help with the itching and rash. Follow in 1 year for an annual exam or sooner as needed.

## 2012-08-01 NOTE — Progress Notes (Signed)
  Subjective:    Patient ID: Laurie Herrera, female    DOB: Nov 15, 1960, 52 y.o.   MRN: [id number]  HPI CALEEN TAAFFE is here for a complete physical.  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  Primarily on arms, legs, and back.  Worse after a bath or shower or if she scratches herself.  None present today.  Hot Flashes Reports night sweats for the last year or so.  Also has some daytime sweats, but these are not as bothersome.  Night sweats cause difficultly with sleeping.  Has noticed some mild mood swings.  LMP was just over 1 year ago.  Review of Systems Patient Information Form: Screening and ROS  Do you feel safe in relationships? yes PHQ-2:negative  Review of Symptoms  General:  Negative for nexplained weight loss, fever Skin: Negative for new or changing mole, sore that won't heal HEENT: Negative for trouble hearing, trouble seeing, ringing in ears, mouth sores, hoarseness, change in voice, dysphagia. CV:  Negative for chest pain, dyspnea, edema, palpitations Resp: Negative for cough, dyspnea, hemoptysis GI: Negative for nausea, vomiting, diarrhea, constipation, abdominal pain, melena, hematochezia. GU: Negative for dysuria, incontinence, urinary hesitance, hematuria, vaginal or penile discharge, polyuria, sexual difficulty, lumps in testicle or breasts MSK: Negative for muscle cramps or aches, joint pain or swelling Neuro: Negative for headaches, weakness, numbness, dizziness, passing out/fainting Psych: Negative for depression, anxiety, memory problems      Objective:   Physical Exam BP  129/85  Pulse 69  Ht 5' (1.524 m)  Wt 175 lb (79.379 kg)  BMI 34.18 kg/m2 Gen: alert, cooperative, NAD HEENT: AT/[place], sclera white, MMM Neck: supple, no LAD CV: RRR, no murmurs Pulm: CTAB, no wheezes or rales Abd: +BS, soft, NTND Ext: no edema, 2+ DP pulses bilaterally Skin: no rashes or suspicious lesions noted     Assessment & Plan:

## 2012-08-01 NOTE — Assessment & Plan Note (Addendum)
Health maintenance is up to date except for Tdap, which she will receive today.  Discussed weight loss, diet and exercise.  She is down 15lbs from last year.  Will check screening CMP, CBC, and TSH.

## 2012-08-01 NOTE — Assessment & Plan Note (Signed)
Worse at night.  Discussed options including HRT and gabapentin.  Patient opted to try gabapentin. Will start 300mg  at bedtime, increase to 600mg  after one week if needed.

## 2012-08-01 NOTE — Assessment & Plan Note (Signed)
History consistent with this, although exam was negative for dermatographia.  Will start loratadine 10mg  daily.

## 2012-08-02 LAB — TSH: TSH: 1.641 u[IU]/mL (ref 0.350–4.500)

## 2012-08-04 ENCOUNTER — Encounter: Payer: Self-pay | Admitting: Emergency Medicine

## 2013-06-23 ENCOUNTER — Ambulatory Visit (INDEPENDENT_AMBULATORY_CARE_PROVIDER_SITE_OTHER): Payer: PRIVATE HEALTH INSURANCE | Admitting: Family Medicine

## 2013-06-23 ENCOUNTER — Encounter: Payer: Self-pay | Admitting: Family Medicine

## 2013-06-23 VITALS — BP 142/90 | HR 71 | Temp 97.5°F | Ht 60.0 in | Wt 178.0 lb

## 2013-06-23 DIAGNOSIS — J069 Acute upper respiratory infection, unspecified: Secondary | ICD-10-CM | POA: Insufficient documentation

## 2013-06-23 DIAGNOSIS — B9789 Other viral agents as the cause of diseases classified elsewhere: Principal | ICD-10-CM

## 2013-06-23 MED ORDER — FLUTICASONE PROPIONATE 50 MCG/ACT NA SUSP: 2.0000 | Freq: Every day | NASAL | Status: DC

## 2013-06-23 MED ORDER — BENZONATATE 100 MG PO CAPS: 100.0000 mg | ORAL_CAPSULE | Freq: Two times a day (BID) | ORAL | Status: DC | PRN

## 2013-06-23 NOTE — Patient Instructions (Signed)
Dear Laurie Herrera, Thank you for coming in to clinic today. It was good to meet you!  Today we discussed your recent symptoms: 1. It sounds like you have an viral upper respiratory infection, that has progressed to some viral laryngitis with cough 2. Sent prescriptions for Flonase (steroid nose spray) use 2 sprays in each nostril daily for the next 2 weeks. 3. Sent prescription for Lewayne Bunting - take as prescribed as needed for cough 4. Highly recommend taking regular Tylenol as needed (no more than 4000mg  in a day) 5. Continue Mucinex 6. Keep well hydrated to help thin out your secretions!  Please schedule a follow-up appointment with Dr. Bridgett Larsson or any available provider within 1 week if your symptoms worsen or do not improve, especially if you develop persistent fevers, headaches, sinus pain, or productive thick cough, shortness of breath or chest pain.  If you have any other questions or concerns, please feel free to call the clinic to contact me. You may also schedule an earlier appointment if necessary.  However, if your symptoms get significantly worse, please go to the Emergency Department to seek immediate medical attention.  Nobie Putnam, [place]

## 2013-06-23 NOTE — Assessment & Plan Note (Addendum)
Currently improved since onset, change in symptoms now with hoarseness and cough. Afebrile, normal non-focal lung exam, no evidence of sinusitis.  Plan: 1. Suspect self-limited, expect improvement within 5-7 days 2. Rx Fluticasone x 2 weeks (h/o allergies, acute reduction of nasal inflammation) 3. Rx Benzonatate PRN cough 4. Recommend Tylenol / Ibuprofen (good kidney function), Saline Nasal spray 5. RTC if worsen / concern sinusitis sx / productive cough

## 2013-06-23 NOTE — Progress Notes (Signed)
Subjective:     Patient ID: Laurie Herrera, female   DOB: 03-Nov-1960, 53 y.o.   MRN: [id number]  Patient presents for a same day visit.  HPI  URI / COUGH: Reports symptoms started about 1 week ago with cough, congestion, runny nose, sore throat, after a few days some improvement in congestion, but now seems to have settled into throat and upper chest with some hoarseness, occasional non-productive coughing spells. Currently improved, tolerating regular diet and fluids. Additionally, reports having similar symptoms yearly around this time. - Tried taking Mucinex, Loratadine - Admits 1x vomiting after coughing, occasional chills - Denies fevers, HA, CP, SOB, abdominal pain, muscle/body aches, nausea - received influenza vaccine this season  I have reviewed and updated the following as appropriate: allergies and current medications  Social Hx: Never smoked, some h/o second hand smoke - works as CMA   Review of Systems  See above HPI    Objective:   Physical Exam  BP 142/90  Pulse 71  Temp(Src) 97.5 F (36.4 C) (Oral)  Ht 5' (1.524 m)  Wt 178 lb (80.74 kg)  BMI 34.76 kg/m2  SpO2 97%  Gen - very pleasant, well-appearing, NAD HEENT - sinuses non-tender to palpation, mild b/l turbinate edema but patent nares w/o congestion, oropharynx clear, MMM Neck - supple, non-tender, no LAD Heart - RRR, no murmurs heard Lungs - CTAB, no wheezing, crackles, or rhonchi. Normal work of breathing Abd - soft, NTND Skin - warm, dry, no rashes Neuro - awake, alert, oriented     Assessment:     See specific A&P problem list for details.      Plan:     See specific A&P problem list for details.

## 2013-09-15 ENCOUNTER — Encounter: Payer: Self-pay | Admitting: Family Medicine

## 2013-09-15 ENCOUNTER — Ambulatory Visit (INDEPENDENT_AMBULATORY_CARE_PROVIDER_SITE_OTHER): Payer: PRIVATE HEALTH INSURANCE | Admitting: Family Medicine

## 2013-09-15 VITALS — BP 124/80 | HR 80 | Temp 98.2°F | Ht 60.0 in | Wt 178.0 lb

## 2013-09-15 DIAGNOSIS — H10029 Other mucopurulent conjunctivitis, unspecified eye: Secondary | ICD-10-CM | POA: Insufficient documentation

## 2013-09-15 MED ORDER — OLOPATADINE HCL 0.2 % OP SOLN: 1.0000 [drp] | Freq: Every day | OPHTHALMIC | Status: AC

## 2013-09-15 NOTE — Patient Instructions (Signed)
You have viral conjunctivitis or pink eye THis should go away in another 3 to 10 days Please use the pataday drops for relief You may also try Zaditor if the pataday is too expensive Please come back or call if you develop pain or fevers associated with the eye pain  Conjunctivitis Conjunctivitis is commonly called "pink eye." Conjunctivitis can be caused by bacterial or viral infection, allergies, or injuries. There is usually redness of the lining of the eye, itching, discomfort, and sometimes discharge. There may be deposits of matter along the eyelids. A viral infection usually causes a watery discharge, while a bacterial infection causes a yellowish, thick discharge. Pink eye is very contagious and spreads by direct contact. You may be given antibiotic eyedrops as part of your treatment. Before using your eye medicine, remove all drainage from the eye by washing gently with warm water and cotton balls. Continue to use the medication until you have awakened 2 mornings in a row without discharge from the eye. Do not rub your eye. This increases the irritation and helps spread infection. Use separate towels from other household members. Wash your hands with soap and water before and after touching your eyes. Use cold compresses to reduce pain and sunglasses to relieve irritation from light. Do not wear contact lenses or wear eye makeup until the infection is gone. SEEK MEDICAL CARE IF:   Your symptoms are not better after 3 days of treatment.  You have increased pain or trouble seeing.  The outer eyelids become very red or swollen. Document Released: 05/24/2004 Document Revised: 07/09/2011 Document Reviewed: 04/16/2005 [facility] Patient Information 2014 [place].

## 2013-09-15 NOTE — Assessment & Plan Note (Signed)
Viral conjunctivitis  precuations given and typical infectious process discussed indepth Start pataday (if not covered can get Zaditor) for symptomatic injection Pt to call back if symtpoms of bacterial infection start.

## 2013-09-15 NOTE — Progress Notes (Signed)
Laurie Herrera is a 53 y.o. female who presents to [facility] today for SD appt for L eye discharge  L eye discharge: started 2 days ago. Associated w/ red eye. No change in overall condition. Crustingin the morning. Warm compress w/ some relief. Denies vision change, HA, pain, itching. Recent cold/URI.    The following portions of the patient's history were reviewed and updated as appropriate: allergies, current medications, past medical history, family and social history, and problem list.  Patient is a nonsmoker.  Past Medical History  Diagnosis Date  . Allergy     ROS as above otherwise neg.    Medications reviewed. Current Outpatient Prescriptions  Medication Sig Dispense Refill  . aspirin 81 MG tablet Take 160 mg by mouth daily.        . benzonatate (TESSALON) 100 MG capsule Take 1 capsule (100 mg total) by mouth 2 (two) times daily as needed for cough.  20 capsule  0  . fluticasone (FLONASE) 50 MCG/ACT nasal spray Place 2 sprays into both nostrils daily.  16 g  1  . gabapentin (NEURONTIN) 300 MG capsule Take 1 capsule at bedtime for one week, then increase to 2 capsules.  60 capsule  3  . loratadine (CLARITIN) 10 MG tablet Take 1 tablet (10 mg total) by mouth daily.  30 tablet  3  . Olopatadine HCl 0.2 % SOLN Apply 1 drop to eye daily.  2.5 mL  0   No current facility-administered medications for this visit.    Exam:  BP 124/80  Pulse 80  Temp(Src) 98.2 F (36.8 C) (Oral)  Ht 5' (1.524 m)  Wt 178 lb (80.74 kg)  BMI 34.76 kg/m2 Gen: Well NAD HEENT: EOMI,  MMM, L scleral injection. PERRL,  Lungs: CTABL Nl WOB Heart: RRR no MRG Abd: NABS, NT, ND Exts: Non edematous BL  LE, warm and well perfused.   No results found for this or any previous visit (from the past 72 hour(s)).  A/P (as seen in Problem list)  Pink eye Viral conjunctivitis  precuations given and typical infectious process discussed indepth Start pataday (if not covered can get Zaditor) for symptomatic  injection Pt to call back if symtpoms of bacterial infection start.

## 2013-10-09 ENCOUNTER — Encounter: Payer: Self-pay | Admitting: Emergency Medicine

## 2013-10-09 ENCOUNTER — Ambulatory Visit (INDEPENDENT_AMBULATORY_CARE_PROVIDER_SITE_OTHER): Payer: PRIVATE HEALTH INSURANCE | Admitting: Emergency Medicine

## 2013-10-09 VITALS — BP 146/86 | HR 81 | Temp 98.1°F | Wt 180.0 lb

## 2013-10-09 DIAGNOSIS — E669 Obesity, unspecified: Secondary | ICD-10-CM

## 2013-10-09 DIAGNOSIS — Z0000 Encounter for general adult medical examination without abnormal findings: Secondary | ICD-10-CM

## 2013-10-09 NOTE — Assessment & Plan Note (Signed)
Patient to schedule mammogram, otherwise up-to-date on health maintenance. Will check lipid panel and CMP today. Discussed diet and exercise to help obtain a healthy weight. Followup in one year or sooner as needed.

## 2013-10-09 NOTE — Progress Notes (Signed)
   Subjective:    Patient ID: Laurie Herrera, female    DOB: 14-Oct-1960, 53 y.o.   MRN: [id number]  HPI Laurie Herrera is here for annual exam.  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  States she is very active at work and around the house.  She does not get additional exercise.  She eats a balanced diet, but often skips breakfast.  Health Maintenance: pap due in 04/2014.  Up to date on colonoscopy.  She will schedule a mammogram.  Current Outpatient Prescriptions on File Prior to Visit  Medication Sig Dispense Refill  . fluticasone (FLONASE) 50 MCG/ACT nasal spray Place 2 sprays into both nostrils daily.  16 g  1  . loratadine (CLARITIN) 10 MG tablet Take 1 tablet (10 mg total) by mouth daily.  30 tablet  3  . aspirin 81 MG tablet Take 160 mg by mouth daily.        . Olopatadine HCl 0.2 % SOLN Apply 1 drop to eye daily.  2.5 mL  0   No current facility-administered medications on file prior to visit.    Review of Systems  Constitutional: Negative for fever and activity change.  HENT: Negative.   Respiratory: Negative for shortness of breath.   Cardiovascular: Negative for chest pain and leg swelling.  Gastrointestinal: Negative for nausea, vomiting, abdominal pain, diarrhea and constipation.  Genitourinary: Negative for dysuria, vaginal bleeding and difficulty urinating.  Musculoskeletal: Negative for arthralgias and myalgias.  Skin: Negative for rash.  Neurological: Negative for dizziness and headaches.       Objective:   Physical Exam BP 146/86  Pulse 81  Temp(Src) 98.1 F (36.7 C) (Oral)  Wt 180 lb (81.647 kg)  LMP 03/17/2011 Gen: alert, cooperative, NAD HEENT: AT/[place], sclera white, MMM Neck:  supple, no LAD, no bruits CV: RRR, no murmurs Pulm: CTAB, no wheezes or rales Ext: no edema, 2+ DP pulses bilaterally Neuro: 5/5 strength in all extremities      Assessment & Plan:

## 2013-10-09 NOTE — Patient Instructions (Signed)
It was nice to see you!  Please schedule a mammogram.  We are checking some blood work today.  I will call you if anything is wrong, otherwise you will get a letter with the results in the mail in 1-2 weeks.  Follow up in 1 year for annual exam and pap smear or sooner as needed.

## 2013-10-10 LAB — COMPLETE METABOLIC PANEL WITH GFR
ALT: 13 U/L (ref 0–35)
AST: 14 U/L (ref 0–37)
Albumin: 3.8 g/dL (ref 3.5–5.2)
Alkaline Phosphatase: 79 U/L (ref 39–117)
BILIRUBIN TOTAL: 0.3 mg/dL (ref 0.2–1.2)
BUN: 13 mg/dL (ref 6–23)
CALCIUM: 8.8 mg/dL (ref 8.4–10.5)
CHLORIDE: 104 meq/L (ref 96–112)
CO2: 27 meq/L (ref 19–32)
CREATININE: 0.8 mg/dL (ref 0.50–1.10)
GFR, Est Non African American: 84 mL/min
Glucose, Bld: 100 mg/dL — ABNORMAL HIGH (ref 70–99)
Potassium: 3.8 mEq/L (ref 3.5–5.3)
Sodium: 138 mEq/L (ref 135–145)
Total Protein: 6.8 g/dL (ref 6.0–8.3)

## 2013-10-10 LAB — LIPID PANEL
CHOLESTEROL: 176 mg/dL (ref 0–200)
HDL: 40 mg/dL (ref >39)
LDL CALC: 113 mg/dL — AB (ref 0–99)
TRIGLYCERIDES: 116 mg/dL (ref <150)
Total CHOL/HDL Ratio: 4.4 Ratio
VLDL: 23 mg/dL (ref 0–40)

## 2013-10-12 ENCOUNTER — Encounter: Payer: Self-pay | Admitting: Emergency Medicine

## 2015-02-14 ENCOUNTER — Encounter: Payer: Self-pay | Admitting: Family Medicine

## 2015-02-14 ENCOUNTER — Ambulatory Visit (INDEPENDENT_AMBULATORY_CARE_PROVIDER_SITE_OTHER): Payer: PRIVATE HEALTH INSURANCE | Admitting: Family Medicine

## 2015-02-14 ENCOUNTER — Ambulatory Visit (HOSPITAL_COMMUNITY)
Admission: RE | Admit: 2015-02-14 | Discharge: 2015-02-14 | Disposition: A | Discharge status: Home / Self Care | Payer: PRIVATE HEALTH INSURANCE | Source: Ambulatory Visit | Attending: Family Medicine | Admitting: Family Medicine

## 2015-02-14 VITALS — BP 133/68 | HR 79 | Temp 98.4°F | Ht 60.0 in | Wt 177.6 lb

## 2015-02-14 DIAGNOSIS — R002 Palpitations: Secondary | ICD-10-CM | POA: Diagnosis not present

## 2015-02-14 DIAGNOSIS — R252 Cramp and spasm: Secondary | ICD-10-CM | POA: Diagnosis not present

## 2015-02-14 DIAGNOSIS — E663 Overweight: Secondary | ICD-10-CM

## 2015-02-14 DIAGNOSIS — R9431 Abnormal electrocardiogram [ECG] [EKG]: Secondary | ICD-10-CM | POA: Insufficient documentation

## 2015-02-14 NOTE — Progress Notes (Signed)
    Subjective: CC: leg spasm HPI: Patient is a 54 y.o. female presenting to clinic today for same day appt. Concerns today include:  1. Leg spasm Patient reports that spasm started a couple of months ago and resolved.  She notes that symptoms returned on Friday and continued through most of the day.  Spasm/ pain is in LLE and was shooting in nature.  No numbness and tingling.  She cannot recall a precipitating event.  No injury.  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  Pain has resolved.  Nothing made it worse.  She tried stretches and this did not relieve pain.  Did not take any OTC meds.  She did try a heating pad, which helped.  Area was NOT hot, swollen or red.  She also rested, which seemed to help as symptoms have not returned.  Does not take exogenous estrogen/ OCPs.  No family h/o clotting disorders.  No recent travel or immobility.  2. Chest discomfort Patient notes that this occurred several weeks ago.  Pain radiated from shoulder to shoulder that lasted about 5-6 seconds x3-4 times.  Denies nausea, vomiting, SOB, edema.  Endorses some sweating during episode.  Symptoms occurred at rest all of the sudden.  Mother passed from stroke/aneurysm.  Father passed from CHF.  Patient endorses occ palpitations.  She attributes this to menopause.  Social History Reviewed: non smoker. FamHx and MedHx updated.  Please see EMR.  ROS: Per HPI  Objective: Office vital signs reviewed. BP 133/68 mmHg  Pulse 79  Temp(Src) 98.4 F (36.9 C) (Oral)  Ht 5' (1.524 m)  Wt 177 lb 9.6 oz (80.559 kg)  BMI 34.69 kg/m2  LMP 03/17/2011  Physical Examination:  General: Awake, alert, well nourished, well appearing female, NAD HEENT: Normal, EOMI Cardio: slight irregularity ?PVCs, S1S2 heard, no murmurs appreciated Pulm: CTAB, no wheezes, rhonchi or rales, normal WOB Extremities: WWP, No edema, cyanosis or clubbing; +2 PT pulses bilaterally, negative Homans, b/l LE equal in girth, no erythema,  edema or ecchymosis, no TTP to calf MSK: Normal gait and station Skin: dry, intact, no rashes or lesions Neuro: Strength and sensation grossly intact  Assessment/ Plan: 54 y.o. female with  1. Cramps of left lower extremity.  Low suspicion for DVT at this time.  ?electrolyte imbalance vs benign cramping of LE. - BASIC METABOLIC PANEL WITH GFR; Future (looking for electrolyte imbalance) - Consider LE [place] if symptoms return - Return precautions reviewed, patient voiced good understanding  2. Palpitations.  No evidence of arrhythmia on EKG.  EKG unofficially reads " septal infarct", though I do not see evidence of ischemia on this EKG.   - EKG 12-Lead - TSH; Future - BASIC METABOLIC PANEL WITH GFR; Future  3. Overweight - Will obtain patient's labs in anticipation for annual PE scheduled for 11/4. - Lipid Panel; Future - BASIC METABOLIC PANEL WITH GFR; Future - Follow up as scheduled for routine care   Janora Norlander, DO PGY-2, [place]

## 2015-02-14 NOTE — Patient Instructions (Signed)
It was a pleasure seeing you today, Laurie Herrera.  Information regarding what we discussed is included in this packet.  I will contact you will the results of your labs.  If anything is abnormal, I will call you.  Otherwise, expect a copy to be mailed to you.  Plan to follow up as scheduled for annual exam  Please feel free to call our office at [phone] if any questions or concerns arise.  Warm Regards, Ashly M. Gottschalk, DO Leg Cramps Leg cramps occur when a muscle or muscles tighten and you have no control over this tightening (involuntary muscle contraction). Muscle cramps can develop in any muscle, but the most common place is in the calf muscles of the leg. Those cramps can occur during exercise or when you are at rest. Leg cramps are painful, and they may last for a few seconds to a few minutes. Cramps may return several times before they finally stop. Usually, leg cramps are not caused by a serious medical problem. In many cases, the cause is not known. Some common causes include:  Overexertion.  Overuse from repetitive motions, or doing the same thing over and over.  Remaining in a certain position for a long period of time.  Improper preparation, form, or technique while performing a sport or an activity.  Dehydration.  Injury.  Side effects of some medicines.  Abnormally low levels of the salts and ions in your blood (electrolytes), especially potassium and calcium. These levels could be low if you are taking water pills (diuretics) or if you are pregnant. HOME CARE INSTRUCTIONS Watch your condition for any changes. Taking the following actions may help to lessen any discomfort that you are feeling:  Stay well-hydrated. Drink enough fluid to keep your urine clear or pale yellow.  Try massaging, stretching, and relaxing the affected muscle. Do this for several minutes at a time.  For tight or tense muscles, use a warm towel, heating pad, or hot shower water directed to  the affected area.  If you are sore or have pain after a cramp, applying ice to the affected area may relieve discomfort.  Put ice in a plastic bag.  Place a towel between your skin and the bag.  Leave the ice on for 20 minutes, 2-3 times per day.  Avoid strenuous exercise for several days if you have been having frequent leg cramps.  Make sure that your diet includes the essential minerals for your muscles to work normally.  Take medicines only as directed by your health care provider. SEEK MEDICAL CARE IF:  Your leg cramps get more severe or more frequent, or they do not improve over time.  Your foot becomes cold, numb, or blue.   This information is not intended to replace advice given to you by your health care provider. Make sure you discuss any questions you have with your health care provider.   Document Released: 05/24/2004 Document Revised: 08/31/2014 Document Reviewed: 03/24/2014 Elsevier Interactive Patient Education [employer].

## 2015-02-15 ENCOUNTER — Other Ambulatory Visit: Payer: PRIVATE HEALTH INSURANCE

## 2015-02-15 DIAGNOSIS — E663 Overweight: Secondary | ICD-10-CM

## 2015-02-15 DIAGNOSIS — R002 Palpitations: Secondary | ICD-10-CM

## 2015-02-15 DIAGNOSIS — R252 Cramp and spasm: Secondary | ICD-10-CM

## 2015-02-15 LAB — LIPID PANEL
Cholesterol: 198 mg/dL (ref 125–200)
HDL: 38 mg/dL — AB (ref >=46)
LDL CALC: 144 mg/dL — AB (ref <130)
Total CHOL/HDL Ratio: 5.2 Ratio — ABNORMAL HIGH (ref <=5.0)
Triglycerides: 78 mg/dL (ref <150)
VLDL: 16 mg/dL (ref <30)

## 2015-02-15 LAB — BASIC METABOLIC PANEL WITH GFR
BUN: 13 mg/dL (ref 7–25)
CHLORIDE: 105 mmol/L (ref 98–110)
CO2: 28 mmol/L (ref 20–31)
CREATININE: 0.74 mg/dL (ref 0.50–1.05)
Calcium: 9.5 mg/dL (ref 8.6–10.4)
GFR, Est African American: >89 mL/min (ref >=60)
Glucose, Bld: 75 mg/dL (ref 65–99)
POTASSIUM: 4.1 mmol/L (ref 3.5–5.3)
SODIUM: 141 mmol/L (ref 135–146)

## 2015-02-15 LAB — TSH: TSH: 1.132 u[IU]/mL (ref 0.350–4.500)

## 2015-02-15 NOTE — Progress Notes (Signed)
Labs done today Tavious Griesinger 

## 2015-02-16 ENCOUNTER — Encounter: Payer: Self-pay | Admitting: Family Medicine

## 2015-03-04 ENCOUNTER — Encounter: Payer: PRIVATE HEALTH INSURANCE | Admitting: Internal Medicine

## 2016-03-20 ENCOUNTER — Other Ambulatory Visit (HOSPITAL_COMMUNITY)
Admission: RE | Admit: 2016-03-20 | Discharge: 2016-03-20 | Disposition: A | Discharge status: Home / Self Care | Payer: Managed Care, Other (non HMO) | Source: Ambulatory Visit | Attending: Family Medicine | Admitting: Family Medicine

## 2016-03-20 ENCOUNTER — Encounter: Payer: Self-pay | Admitting: Internal Medicine

## 2016-03-20 ENCOUNTER — Ambulatory Visit (INDEPENDENT_AMBULATORY_CARE_PROVIDER_SITE_OTHER): Payer: Managed Care, Other (non HMO) | Admitting: Internal Medicine

## 2016-03-20 VITALS — BP 125/85 | HR 70 | Temp 98.1°F | Ht 60.0 in | Wt 184.0 lb

## 2016-03-20 DIAGNOSIS — Z23 Encounter for immunization: Secondary | ICD-10-CM

## 2016-03-20 DIAGNOSIS — Z124 Encounter for screening for malignant neoplasm of cervix: Secondary | ICD-10-CM | POA: Diagnosis not present

## 2016-03-20 DIAGNOSIS — Z1151 Encounter for screening for human papillomavirus (HPV): Secondary | ICD-10-CM | POA: Insufficient documentation

## 2016-03-20 DIAGNOSIS — Z1159 Encounter for screening for other viral diseases: Secondary | ICD-10-CM

## 2016-03-20 DIAGNOSIS — Z01419 Encounter for gynecological examination (general) (routine) without abnormal findings: Secondary | ICD-10-CM | POA: Insufficient documentation

## 2016-03-20 DIAGNOSIS — Z114 Encounter for screening for human immunodeficiency virus [HIV]: Secondary | ICD-10-CM

## 2016-03-20 DIAGNOSIS — R200 Anesthesia of skin: Secondary | ICD-10-CM

## 2016-03-20 DIAGNOSIS — E669 Obesity, unspecified: Secondary | ICD-10-CM

## 2016-03-20 LAB — HIV ANTIBODY (ROUTINE TESTING W REFLEX): HIV 1&2 Ab, 4th Generation: NONREACTIVE

## 2016-03-20 NOTE — Patient Instructions (Signed)
Ms. Shine,  I will send you a letter with your lab results.  I recommend trying your wrist braces at night for the numbness. If not improved in 1 month, please call back, and I will refer you to Neurology.  For your knee, you may want to try a brace for extra support, especially as you continue to exercise.  Capsaicin cream may provide some relief for pain in your hands (over the counter).  Debrox (over the counter) is a good ear wax softener.  Best, Dr. Ola Spurr

## 2016-03-21 ENCOUNTER — Encounter: Payer: Self-pay | Admitting: Internal Medicine

## 2016-03-21 LAB — HEPATITIS C ANTIBODY: HCV AB: NEGATIVE

## 2016-03-21 NOTE — Progress Notes (Signed)
[facility] Progress Note  Subjective:  Laurie Herrera is a 55 y.o. female who presents for check-up. She also has concern about hand numbness.  Hand numbness: - Says she has a history of carpal tunnel syndrome that improved with wearing wrist splints at night - For past few months has had more numbness/pain in thumb and pointer fingers - Has not tried anything for pain or restarted using wrist splints - In addition, had 10 seconds of L arm numbness when walking around grocery store 2 weeks ago. ROS: No weakness, no chest pain  Obesity: - Has started going to the gym for past couple of months - Was going 5 x a week but her trainer left, so now she goes at least 3 times a week-- enjoys the elliptical and jogging on treadmill. - Sometimes has knee pain, medial aspect of R knee can feel warm but never has been red.   Social: - Does not smoke or drink.  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 Does report hand numbness as above.    Chief Complaint  Patient presents with  . Annual Exam    Past Medical History:  Diagnosis Date  . Allergy     Social History   Social History  . Marital status: Single    Spouse name: N/A  . Number of children: N/A  . Years of education: N/A   Occupational History  . Not on file.   Social History Main Topics  . Smoking status: Never Smoker  . Smokeless tobacco: Never Used  . Alcohol use No  . Drug use: No  . Sexual activity: No   Other Topics Concern  . Not on file   Social History Narrative  . No narrative on file    No Known Allergies  Objective: Blood pressure 125/85, pulse 70, temperature 98.1 F (36.7  C), temperature source Oral, height 5' (1.524 m), weight 83.5 kg (184 lb), last menstrual period 03/17/2011.Body mass index is 35.94 kg/m. Constitutional: Obese, well-appearing female in NAD Cardiovascular: RRR, S1, S2, no m/r/g.  Pulmonary/Chest: Effort normal and breath sounds normal. No respiratory distress.  Abdominal: Soft. +BS, NT, ND, no rebound or guarding.  Musculoskeletal: No LE edema. Symmetric L and R knees without effusion. Normal ROM.  Neurological: AOx3, no focal deficits. Negative Tinel and Phalen tests. Sensation intact to soft and sharp across palmar and dorsal surfaces of hands. Hand grip 5/5 bilaterally.  Skin: Skin is warm and dry. No rash noted on exposed skin. Psychiatric: Normal mood and affect.  Vitals reviewed  PHQ-2: 0  Assessment/Plan: Hand numbness - Recommended wearing wrist splints at night again - If no improvement, would refer to Neurology though exam today without sensory deficits  Obesity (BMI 35.0-39.9 without comorbidity) - Encouraged patient to continue regular exercise and to watch portion control - Based on lipid panel from last year, patient's 10-year ASCVD risk is about 5.5%, so would not recommend rechecking this year or starting statin  Call if no improvement in 1 month after wearing wrist splints; would then refer to Neurology.   Olene Floss, MD [employer], PGY-2

## 2016-03-23 ENCOUNTER — Encounter: Payer: Self-pay | Admitting: Internal Medicine

## 2016-03-23 DIAGNOSIS — E669 Obesity, unspecified: Secondary | ICD-10-CM | POA: Insufficient documentation

## 2016-03-23 DIAGNOSIS — R200 Anesthesia of skin: Secondary | ICD-10-CM | POA: Insufficient documentation

## 2016-03-23 NOTE — Assessment & Plan Note (Signed)
-   Recommended wearing wrist splints at night again - If no improvement, would refer to Neurology though exam today without sensory deficits

## 2016-03-23 NOTE — Assessment & Plan Note (Signed)
-   Encouraged patient to continue regular exercise and to watch portion control - Based on lipid panel from last year, patient's 10-year ASCVD risk is about 5.5%, so would not recommend rechecking this year or starting statin

## 2016-03-26 LAB — CYTOLOGY - PAP
Diagnosis: NEGATIVE
HPV (WINDOPATH): NOT DETECTED

## 2016-04-02 ENCOUNTER — Telehealth: Payer: Self-pay | Admitting: Internal Medicine

## 2016-04-02 NOTE — Telephone Encounter (Signed)
Pt picked up insurance papers recently that PCP filled out, but the signature was in the wrong spot. Pt will be dropping off the forms tomorrow and will need to pick them up Wednesday afternoon at 3pm. Please advise. Thanks! ep

## 2016-04-03 ENCOUNTER — Telehealth: Payer: Self-pay | Admitting: Internal Medicine

## 2016-04-03 NOTE — Telephone Encounter (Signed)
[employer]ident wellness benefit claim form dropped off for at front desk for completion.  Verified that patient section of form has been completed.  Last DOS/WCC with PCP was 03/20/16.  Placed form in red team folder to be completed by clinical staff.  Carmina Miller

## 2016-04-04 NOTE — Telephone Encounter (Signed)
See additional phone note. 

## 2016-04-04 NOTE — Telephone Encounter (Signed)
Forms placed in PCP box. 

## 2016-04-04 NOTE — Telephone Encounter (Signed)
I do not see the form in my inbox. Could you ask patient if she dropped these off? I can fill out anytime the rest of the week if she is able to bring the form.

## 2016-04-04 NOTE — Telephone Encounter (Signed)
Placed completed form in RN box. Does not appear to need physician signature.

## 2016-04-04 NOTE — Telephone Encounter (Signed)
Left voice message for patient that form is ready for pick up.  Derl Barrow, RN

## 2016-04-08 ENCOUNTER — Encounter: Payer: Self-pay | Admitting: Internal Medicine

## 2016-08-14 ENCOUNTER — Encounter: Payer: Self-pay | Admitting: Gastroenterology

## 2017-07-30 ENCOUNTER — Encounter: Payer: Self-pay | Admitting: Internal Medicine

## 2017-07-30 ENCOUNTER — Ambulatory Visit (INDEPENDENT_AMBULATORY_CARE_PROVIDER_SITE_OTHER): Payer: Managed Care, Other (non HMO) | Admitting: Internal Medicine

## 2017-07-30 ENCOUNTER — Ambulatory Visit (HOSPITAL_COMMUNITY)
Admission: RE | Admit: 2017-07-30 | Discharge: 2017-07-30 | Disposition: A | Discharge status: Home / Self Care | Payer: Managed Care, Other (non HMO) | Source: Ambulatory Visit | Attending: Family Medicine | Admitting: Family Medicine

## 2017-07-30 ENCOUNTER — Other Ambulatory Visit: Payer: Self-pay

## 2017-07-30 VITALS — BP 162/90 | HR 67 | Temp 97.9°F | Ht 60.0 in | Wt 184.8 lb

## 2017-07-30 DIAGNOSIS — R030 Elevated blood-pressure reading, without diagnosis of hypertension: Secondary | ICD-10-CM

## 2017-07-30 DIAGNOSIS — E669 Obesity, unspecified: Secondary | ICD-10-CM | POA: Diagnosis not present

## 2017-07-30 DIAGNOSIS — R079 Chest pain, unspecified: Secondary | ICD-10-CM

## 2017-07-30 DIAGNOSIS — J069 Acute upper respiratory infection, unspecified: Secondary | ICD-10-CM | POA: Diagnosis not present

## 2017-07-30 DIAGNOSIS — Z1211 Encounter for screening for malignant neoplasm of colon: Secondary | ICD-10-CM

## 2017-07-30 DIAGNOSIS — R9431 Abnormal electrocardiogram [ECG] [EKG]: Secondary | ICD-10-CM | POA: Insufficient documentation

## 2017-07-30 DIAGNOSIS — Z1231 Encounter for screening mammogram for malignant neoplasm of breast: Secondary | ICD-10-CM

## 2017-07-30 DIAGNOSIS — I208 Other forms of angina pectoris: Secondary | ICD-10-CM

## 2017-07-30 DIAGNOSIS — R200 Anesthesia of skin: Secondary | ICD-10-CM

## 2017-07-30 DIAGNOSIS — D649 Anemia, unspecified: Secondary | ICD-10-CM

## 2017-07-30 DIAGNOSIS — B9789 Other viral agents as the cause of diseases classified elsewhere: Secondary | ICD-10-CM

## 2017-07-30 DIAGNOSIS — Z1239 Encounter for other screening for malignant neoplasm of breast: Secondary | ICD-10-CM

## 2017-07-30 MED ORDER — FLUTICASONE PROPIONATE 50 MCG/ACT NA SUSP: 2.0000 | Freq: Every day | NASAL | 1 refills | Status: AC

## 2017-07-30 NOTE — Progress Notes (Signed)
[facility] Progress Note  Subjective:  Laurie Herrera is a 57 y.o. female with history of anemia, GERD, obesity and carpal tunnel syndrome who presents for check-up. She notes specific concern of chest tightness over at least the last 6 months. These episodes may occur up to a couple times a month. She has felt light-headed with some of these episodes with darkening of vision. She denies sensation of pressure. She also reports associated tingling/numbness that radiates down both arms. She denies association with eating. 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 She is concerned symptoms could be coming from her heart due to family history with father dying from heart failure in his 51s, mother dying from stroke at 46. Her sister died from complications of diabetes in her 21s.   ROS: No orthopnea, no palpitations  Social: Never smoker, does not drink; does not regularly exercise but has a gym membership  HM: Due for colonoscopy and mammogram  No Known Allergies  Social History   Tobacco Use  . Smoking status: Never Smoker  . Smokeless tobacco: Never Used  Substance Use Topics  . Alcohol use: No    Objective: Blood pressure (!) 162/90, pulse 67, temperature 97.9 F (36.6 C), temperature source Oral, height 5' (1.524 m), weight 184 lb 12.8 oz (83.8 kg), last menstrual period 03/17/2011, SpO2 97 %. Body mass index is 36.09 kg/m. Repeat BP 165/89.  Constitutional: Obese, pleasant female in NAD.  HENT: MMM, PERRL Cardiovascular: RRR, S1, S2, no m/r/g.  Pulmonary/Chest: Effort normal and breath sounds normal.  Abdominal: Soft. +BS, NT Musculoskeletal: No LE edema. Moves all spontaneously. Cannot elicit chest wall tenderness with palpation.  Neurological: AOx3, no focal deficits. Peripheral sensation intact. 5/5 strength of upper and lower extremities.  Skin: Skin is warm and dry. No rash  noted.  Psychiatric: Slightly anxious affect.  Vitals reviewed  Assessment/Plan: Elevated BP without diagnosis of hypertension - BP above goal of <140/90.  - Recommended checking BP at work and returning for BP recheck in 1-2 weeks. Advised patient to call if BP remains above goal when checked at work.  - Anticipate needing to start antihypertensive therapy pending another high blood pressure reading. Would start with CCB or thiazide. - Will check CMP and lipid panel.   Chest pain - Description sounds atypical, not specifically exertional but associated with some presyncopal symptoms. EKG performed today and showed NSR and possible old septal infarct (noted on previous EKG in 2016) - Given multiple risk factors (obesity, family history, elevated BP today), will refer to cardiology.  - Will check CBC given history of anemia  Obesity (BMI 35.0-39.9 without comorbidity) - Patient set goal of going to the gym twice a week  Placed referrals for colonoscopy and mammogram.   Follow-up in 1-2 weeks for BP check.  Olene Floss, MD [place], PGY-3

## 2017-07-30 NOTE — Patient Instructions (Signed)
Laurie Herrera,  Thank you for coming in today.  I will refer you to cardiology.  I will refer you for mammogram and colonoscopy.  Your lab results should be back in a day or two.  Please return in 1-2 weeks for blood pressure recheck. Your goal is less than 140/90. If you could take a few measurements at work, too, that would be helpful.  Your plan is to increase physical activity with going to the gym twice a week.  Best, Dr. Ola Spurr

## 2017-07-31 LAB — COMPREHENSIVE METABOLIC PANEL
ALBUMIN: 4 g/dL (ref 3.5–5.5)
ALT: 13 IU/L (ref 0–32)
AST: 16 IU/L (ref 0–40)
Albumin/Globulin Ratio: 1.3 (ref 1.2–2.2)
Alkaline Phosphatase: 84 IU/L (ref 39–117)
BILIRUBIN TOTAL: 0.3 mg/dL (ref 0.0–1.2)
BUN / CREAT RATIO: 18 (ref 9–23)
BUN: 14 mg/dL (ref 6–24)
CHLORIDE: 105 mmol/L (ref 96–106)
CO2: 21 mmol/L (ref 20–29)
CREATININE: 0.76 mg/dL (ref 0.57–1.00)
Calcium: 9.9 mg/dL (ref 8.7–10.2)
GFR calc non Af Amer: 87 mL/min/{1.73_m2} (ref >59)
GFR, EST AFRICAN AMERICAN: 100 mL/min/{1.73_m2} (ref >59)
GLOBULIN, TOTAL: 3.2 g/dL (ref 1.5–4.5)
GLUCOSE: 87 mg/dL (ref 65–99)
Potassium: 4.6 mmol/L (ref 3.5–5.2)
SODIUM: 141 mmol/L (ref 134–144)
TOTAL PROTEIN: 7.2 g/dL (ref 6.0–8.5)

## 2017-07-31 LAB — CBC
HEMOGLOBIN: 13.3 g/dL (ref 11.1–15.9)
Hematocrit: 39.7 % (ref 34.0–46.6)
MCH: 26.4 pg — ABNORMAL LOW (ref 26.6–33.0)
MCHC: 33.5 g/dL (ref 31.5–35.7)
MCV: 79 fL (ref 79–97)
PLATELETS: 289 10*3/uL (ref 150–379)
RBC: 5.04 x10E6/uL (ref 3.77–5.28)
RDW: 14.3 % (ref 12.3–15.4)
WBC: 11 10*3/uL — AB (ref 3.4–10.8)

## 2017-07-31 LAB — LIPID PANEL
Chol/HDL Ratio: 4.7 ratio — ABNORMAL HIGH (ref 0.0–4.4)
Cholesterol, Total: 198 mg/dL (ref 100–199)
HDL: 42 mg/dL (ref >39)
LDL CALC: 135 mg/dL — AB (ref 0–99)
Triglycerides: 104 mg/dL (ref 0–149)
VLDL CHOLESTEROL CAL: 21 mg/dL (ref 5–40)

## 2017-08-03 ENCOUNTER — Encounter: Payer: Self-pay | Admitting: Internal Medicine

## 2017-08-03 DIAGNOSIS — R079 Chest pain, unspecified: Secondary | ICD-10-CM | POA: Insufficient documentation

## 2017-08-03 NOTE — Assessment & Plan Note (Signed)
-   BP above goal of <140/90.  - Recommended checking BP at work and returning for BP recheck in 1-2 weeks. Advised patient to call if BP remains above goal when checked at work.  - Anticipate needing to start antihypertensive therapy pending another high blood pressure reading. Would start with CCB or thiazide. - Will check CMP and lipid panel.

## 2017-08-03 NOTE — Assessment & Plan Note (Signed)
-   Patient set goal of going to the gym twice a week

## 2017-08-03 NOTE — Assessment & Plan Note (Signed)
-   Description sounds atypical, not specifically exertional but associated with some presyncopal symptoms. EKG performed today and showed NSR and possible old septal infarct (noted on previous EKG in 2016) - Given multiple risk factors (obesity, family history, elevated BP today), will refer to cardiology.  - Will check CBC given history of anemia

## 2017-08-08 ENCOUNTER — Encounter: Payer: Self-pay | Admitting: Gastroenterology

## 2017-08-26 ENCOUNTER — Ambulatory Visit (INDEPENDENT_AMBULATORY_CARE_PROVIDER_SITE_OTHER): Payer: Managed Care, Other (non HMO)

## 2017-08-26 VITALS — BP 160/96 | HR 74

## 2017-08-26 DIAGNOSIS — Z0130 Encounter for examination of blood pressure without abnormal findings: Secondary | ICD-10-CM

## 2017-08-26 NOTE — Progress Notes (Signed)
   Patient in to nurse clinic for BP check. Patient did not return earlier than this because she went on vacation.   Patient is not on any BP medications. BP was 160/96 x 2, HR 74. Patient denies chest pain, headache, blurred vision, dizziness. Note to PCP.  Danley Danker, RN [facility] RN)

## 2017-08-27 ENCOUNTER — Telehealth: Payer: Self-pay | Admitting: Internal Medicine

## 2017-08-27 MED ORDER — AMLODIPINE BESYLATE 5 MG PO TABS: 5.0000 mg | ORAL_TABLET | Freq: Every day | ORAL | 0 refills | Status: DC

## 2017-08-27 NOTE — Telephone Encounter (Signed)
Left message for patient that I would recommend starting medication for high blood pressure based on her second elevated reading at nurse clinic yesterday. Ordered amlodipine 5 mg daily. Also provided alternative of having ambulatory blood pressure monitoring to rule-out white coat hypertension. Informed her to call for appointment with Dr. Valentina Lucks if this is her preference.  Olene Floss, MD [place], PGY-3

## 2017-08-28 NOTE — Telephone Encounter (Signed)
Pt left message on nurse line that she would like to start BP meds but wants to speak with MD first. Her call back [phone] Wallace Cullens, RN

## 2017-08-29 NOTE — Telephone Encounter (Signed)
Called patient and left her a message asking her to let me know what questions she has. Alternatively, she can make an appointment to discuss BP medication in person.   Olene Floss, MD [place], PGY-3

## 2017-09-12 ENCOUNTER — Encounter: Payer: Self-pay | Admitting: *Deleted

## 2017-09-26 ENCOUNTER — Ambulatory Visit
Admission: RE | Admit: 2017-09-26 | Discharge: 2017-09-26 | Disposition: A | Discharge status: Home / Self Care | Payer: Managed Care, Other (non HMO) | Source: Ambulatory Visit | Attending: Family Medicine | Admitting: Family Medicine

## 2017-09-26 DIAGNOSIS — Z1239 Encounter for other screening for malignant neoplasm of breast: Secondary | ICD-10-CM

## 2017-09-30 ENCOUNTER — Other Ambulatory Visit: Payer: Self-pay

## 2017-09-30 ENCOUNTER — Ambulatory Visit (AMBULATORY_SURGERY_CENTER): Payer: Self-pay | Admitting: *Deleted

## 2017-09-30 VITALS — Ht 60.0 in | Wt 185.0 lb

## 2017-09-30 DIAGNOSIS — Z86010 Personal history of colonic polyps: Secondary | ICD-10-CM

## 2017-09-30 NOTE — Progress Notes (Signed)
In PV pt states she has an OV with cardio 10-07-2017 for chest pain she has had for the past 6 months.  Her colon is 6-11 Tuesday .  Pt informed we have to have cardiac clearance before we can do a colonoscopy- she needs to see cardio 6-10, and do / complete any further testing cardio needs. Once cleared by cardio, she can call and RS her colon.   Pt verbalized understanding- will cancel the 6-11 colon with Dr Sheppard Penton.

## 2017-10-06 NOTE — Progress Notes (Signed)
Cardiology Office Note  Date:  10/07/2017   ID:  KEMISHA BONNETTE, DOB November 19, 1960, MRN [id number]  PCP:  Rogue Bussing, MD   Chief Complaint  Patient presents with  . other    Ref by Dr. Ola Spurr for chest tightness and presyncope. Meds reviewed by the pt. verbally.      HPI:  Laurie Herrera is a 57 y.o. female with history of  anemia,  GERD,  Obesity HTN Nonsmoker, non diabetic Who presents by referral from Dr. Ola Spurr for consultation of her chest tightness  She reports that she has had 3 episodes of tightness across her chest in the past 6 months Typically symptoms will present at rest Lasts 10 sec,  Shoulder to shoulder, burning, weak arms "going to pass out but I don't" She stops what she is doing and typically goes away within a short period of time  One episode she was at the [facility] store Second episode she was home watching dishes Third episode at work doing nothing strenuous Works as Quarry manager No symptoms on any heavy exertion, shopping ADLs etc.  severeal years ago, reports that she used to have more of a chest tightness accompanied with heavy saliva in her mouth  These have resolved, none for several years  Started on norvasc 5, has not started the medication yet. Has not picked it up  Lab work reviewed with her Total chol 198, LDL 135  family history with father dying from heart failure in his 97s,  mother dying from stroke at 79.  Her sister died from complications of diabetes in her 13s.   EKG personally reviewed by myself on todays visit Shows normal sinus rhythm rate 81 bpm no significant ST or T-wave changes  Orthostatics performed which are negative   PMH:   has a past medical history of Allergy, Chest pain, GERD (gastroesophageal reflux disease), and Hypertension.  PSH:    Past Surgical History:  Procedure Laterality Date  . BREAST BIOPSY     right breast  . TUBAL LIGATION      Current Outpatient Medications   Medication Sig Dispense Refill  . aspirin 81 MG tablet Take 81 mg by mouth daily.     . famotidine (PEPCID) 20 MG tablet Take 20 mg by mouth as needed for heartburn or indigestion.    . fluticasone (FLONASE) 50 MCG/ACT nasal spray Place 2 sprays into both nostrils daily. 16 g 1  . loratadine (CLARITIN) 10 MG tablet Take 1 tablet (10 mg total) by mouth daily. 30 tablet 3  . Olopatadine HCl 0.2 % SOLN Apply 1 drop to eye daily. 2.5 mL 0  . amLODipine (NORVASC) 5 MG tablet Take 1 tablet (5 mg total) by mouth daily. (Patient not taking: Reported on 10/07/2017) 90 tablet 0   No current facility-administered medications for this visit.      Allergies:   Patient has no known allergies.   Social History:  The patient  reports that she has never smoked. She has never used smokeless tobacco. She reports that she does not drink alcohol or use drugs.   Family History:   family history includes Diabetes in her paternal uncle and sister; Heart disease in her father and sister; Stroke in her mother.    Review of Systems: Review of Systems  Constitutional: Negative.   Respiratory: Negative.   Cardiovascular: Positive for chest pain.  Gastrointestinal: Negative.   Musculoskeletal: Negative.   Neurological: Negative.   Psychiatric/Behavioral: Negative.   All other  systems reviewed and are negative.    PHYSICAL EXAM: VS:  BP 140/90 (BP Location: Left Arm, Patient Position: Sitting, Cuff Size: Normal)   Pulse 81   Ht 5' (1.524 m)   Wt 181 lb 8 oz (82.3 kg)   LMP 03/17/2011   BMI 35.45 kg/m  , BMI Body mass index is 35.45 kg/m. GEN: Well nourished, well developed, in no acute distress  HEENT: normal  Neck: no JVD, carotid bruits, or masses Cardiac: RRR; no murmurs, rubs, or gallops,no edema  Respiratory:  clear to auscultation bilaterally, normal work of breathing GI: soft, nontender, nondistended, + BS MS: no deformity or atrophy  Skin: warm and dry, no rash Neuro:  Strength and  sensation are intact Psych: euthymic mood, full affect   Recent Labs: 07/30/2017: ALT 13; BUN 14; Creatinine, Ser 0.76; Hemoglobin 13.3; Platelets 289; Potassium 4.6; Sodium 141    Lipid Panel Lab Results  Component Value Date   CHOL 198 07/30/2017   HDL 42 07/30/2017   LDLCALC 135 (H) 07/30/2017   TRIG 104 07/30/2017      Wt Readings from Last 3 Encounters:  10/07/17 181 lb 8 oz (82.3 kg)  09/30/17 185 lb (83.9 kg)  07/30/17 184 lb 12.8 oz (83.8 kg)       ASSESSMENT AND PLAN:  Chest pain, unspecified type - Plan: EKG 12-Lead Atypical symptoms, rare burning across her chest approximately once every 2 months coming on at rest lasting for 10 seconds her last. Unable to exclude spasm, musculoskeletal etiology She initially thought it was from her breasts pulling, Though it is not frequent No symptoms on exertion concerning for angina Relatively benign EKG and clinical exam Recommended she start her amlodipine which could help any possible spasm No further testing at this time but we have recommended she contact us if symptoms increase in frequency or intensity We did discuss her risk factors which include borderline cholesterol but she is a nonsmoker. Borderline elevated sugars We also discussed CT coronary calcium scoring if symptoms get worse  Obesity (BMI 35.0-39.9 without comorbidity) - Plan: EKG 12-Lead We have encouraged continued exercise, careful diet management in an effort to lose weight.  Gastroesophageal reflux disease without esophagitis Unable to exclude GERD as a cause of her symptoms though symptoms are very short-lived She has been having some acid into her throat and mouth If symptoms get worse recommended she try omeprazole  Essential hypertension She will pick up her amlodipine 5 mg daily  Disposition:   F/U  As needed  Patient was seen in consultation for Dr. Ola Spurr and will be referred back to her office for ongoing care of the issue to tell  above   Total encounter time more than 60 minutes  Greater than 50% was spent in counseling and coordination of care with the patient   Orders Placed This Encounter  Procedures  . EKG 12-Lead     Signed, Esmond Plants, M.D., Ph.D. 10/07/2017  [place], [place]

## 2017-10-07 ENCOUNTER — Encounter: Payer: Self-pay | Admitting: Cardiovascular Disease

## 2017-10-07 ENCOUNTER — Ambulatory Visit (INDEPENDENT_AMBULATORY_CARE_PROVIDER_SITE_OTHER): Payer: Managed Care, Other (non HMO) | Admitting: Cardiovascular Disease

## 2017-10-07 VITALS — BP 140/90 | HR 81 | Ht 60.0 in | Wt 181.5 lb

## 2017-10-07 DIAGNOSIS — K219 Gastro-esophageal reflux disease without esophagitis: Secondary | ICD-10-CM

## 2017-10-07 DIAGNOSIS — I10 Essential (primary) hypertension: Secondary | ICD-10-CM | POA: Insufficient documentation

## 2017-10-07 DIAGNOSIS — E669 Obesity, unspecified: Secondary | ICD-10-CM | POA: Diagnosis not present

## 2017-10-07 DIAGNOSIS — R079 Chest pain, unspecified: Secondary | ICD-10-CM

## 2017-10-07 NOTE — Patient Instructions (Addendum)
Please call if chest pain gets worse  Medication Instructions:   Try cetirazine (zyrtec) 1/2 up to whole pill  For acid: you could try omeprazole 20 mg once a day (daily, not a good as needed pill)  Start the amlodipine, good for spasm, blood pressure  Labwork:  No new labs needed  Testing/Procedures:  No further testing at this time  Think about a CT coronary calcium score $150, [place] Screening test for coronary calcification   Follow-Up: It was a pleasure seeing you in the office today. Please call us if you have new issues that need to be addressed before your next appt.  [phone]  Your physician wants you to follow-up in: As needed  If you need a refill on your cardiac medications before your next appointment, please call your pharmacy.  For educational health videos Log in to : [email or web address] Or : [email or web address], password : [email or web address]

## 2017-10-08 ENCOUNTER — Encounter: Payer: Managed Care, Other (non HMO) | Admitting: Gastroenterology

## 2017-10-15 ENCOUNTER — Encounter: Payer: Managed Care, Other (non HMO) | Admitting: Gastroenterology

## 2018-02-26 ENCOUNTER — Other Ambulatory Visit: Payer: Self-pay

## 2018-02-26 ENCOUNTER — Ambulatory Visit (HOSPITAL_COMMUNITY)
Admission: EM | Admit: 2018-02-26 | Discharge: 2018-02-26 | Disposition: A | Discharge status: Home / Self Care | Payer: Managed Care, Other (non HMO) | Attending: Family Medicine | Admitting: Family Medicine

## 2018-02-26 ENCOUNTER — Encounter (HOSPITAL_COMMUNITY): Payer: Self-pay | Admitting: Emergency Medicine

## 2018-02-26 DIAGNOSIS — R0789 Other chest pain: Secondary | ICD-10-CM

## 2018-02-26 MED ORDER — CYCLOBENZAPRINE HCL 10 MG PO TABS: 10.0000 mg | ORAL_TABLET | Freq: Two times a day (BID) | ORAL | 0 refills | Status: AC | PRN

## 2018-02-26 NOTE — ED Provider Notes (Signed)
[facility]    CSN: [id number] Arrival date & time: 02/26/18  1629     History   Chief Complaint Chief Complaint  Patient presents with  . Shoulder Pain    left  . Chest Pain    left  . Back Pain    left    HPI Laurie Herrera is a 57 y.o. female.   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  She moved to bed prior to onset of this pain.  She has had pain similar to this before.  She tells me the fact that it hurts when she turns her head a certain way suggest it probably is related to some muscle strain and I think she is right. We did talk about her blood pressure being elevated and how that might not be so good concerning heart but her pain does not really sound cardiac  HPI  Past Medical History:  Diagnosis Date  . Allergy   . Chest pain    sees cardio 10-07-2017  . GERD (gastroesophageal reflux disease)   . Hypertension     Patient Active Problem List   Diagnosis Date Noted  . Essential hypertension 10/07/2017  . Chest pain 08/03/2017  . Hand numbness 03/23/2016  . Obesity (BMI 35.0-39.9 without comorbidity) 03/23/2016  . Hot flashes, menopausal 08/01/2012  . Annual physical exam 05/17/2011  . Healthcare maintenance 05/17/2011  . MOOD SWINGS 06/23/2009  . GERD 09/03/2007  . Elevated BP without diagnosis of hypertension 06/10/2007  . ANEMIA, OTHER, UNSPECIFIED 06/27/2006    Past Surgical History:  Procedure Laterality Date  . BREAST BIOPSY     right breast  . TUBAL LIGATION      OB History   None      Home Medications    Prior to Admission medications   Medication Sig Start Date End Date Taking? Authorizing Provider  amLODipine (NORVASC) 5 MG tablet Take 1 tablet (5 mg total) by mouth daily. 08/27/17  Yes Rogue Bussing, MD  aspirin 81 MG tablet Take 81 mg by mouth daily.    Yes [provider]  famotidine (PEPCID) 20 MG tablet Take 20 mg by mouth as needed for heartburn or  indigestion.   Yes [provider]  fluticasone (FLONASE) 50 MCG/ACT nasal spray Place 2 sprays into both nostrils daily. 07/30/17  Yes Rogue Bussing, MD  loratadine (CLARITIN) 10 MG tablet Take 1 tablet (10 mg total) by mouth daily. 08/01/12  Yes Melony Overly, MD  Olopatadine HCl 0.2 % SOLN Apply 1 drop to eye daily. 09/15/13  Yes Waldemar Dickens, MD    Family History Family History  Problem Relation Age of Onset  . Heart disease Father   . Diabetes Sister   . Heart disease Sister   . Stroke Mother   . Diabetes Paternal Uncle     Social History Social History   Tobacco Use  . Smoking status: Never Smoker  . Smokeless tobacco: Never Used  Substance Use Topics  . Alcohol use: No  . Drug use: No     Allergies   Patient has no known allergies.   Review of Systems Review of Systems  Constitutional: Negative for chills and fever.  HENT: Negative for ear pain and sore throat.   Eyes: Negative for pain and visual disturbance.  Respiratory: Negative for cough and shortness of breath.   Cardiovascular: Positive for chest pain. Negative for palpitations.  Gastrointestinal: Negative  for abdominal pain and vomiting.  Genitourinary: Negative for dysuria and hematuria.  Musculoskeletal: Negative for arthralgias and back pain.  Skin: Negative for color change and rash.  Neurological: Negative for seizures and syncope.  All other systems reviewed and are negative.    Physical Exam Triage Vital Signs ED Triage Vitals  Enc Vitals Group     BP 02/26/18 1638 (!) 180/78     Pulse Rate 02/26/18 1638 73     Resp --      Temp 02/26/18 1638 98 F (36.7 C)     Temp Source 02/26/18 1638 Oral     SpO2 02/26/18 1638 99 %     Weight --      Height --      Head Circumference --      Peak Flow --      Pain Score 02/26/18 1639 8     Pain Loc --      Pain Edu? --      Excl. in [place]? --    No data found.  Updated Vital Signs BP (!) 180/78 (BP Location: Left Arm)    Pulse 73   Temp 98 F (36.7 C) (Oral)   LMP 03/17/2011   SpO2 99%   Visual Acuity Right Eye Distance:   Left Eye Distance:   Bilateral Distance:    Right Eye Near:   Left Eye Near:    Bilateral Near:     Physical Exam  Constitutional: She appears well-developed and well-nourished.  HENT:  Head: Normocephalic.  Eyes: Pupils are equal, round, and reactive to light.  Cardiovascular: Normal rate and regular rhythm.  Chest wall tenderness and left upper chest as well as left trapezius muscle both in her neck and posteriorly  Pulmonary/Chest: Effort normal and breath sounds normal.     UC Treatments / Results  Labs (all labs ordered are listed, but only abnormal results are displayed) Labs Reviewed - No data to display  EKG None  Radiology No results found.  Procedures Procedures (including critical care time)  Medications Ordered in UC Medications - No data to display  Initial Impression / Assessment and Plan / UC Course  I have reviewed the triage vital signs and the nursing notes.  Pertinent labs & imaging results that were available during my care of the patient were reviewed by me and considered in my medical decision making (see chart for details).     Chest pain noncardiac.  Likely musculoskeletal.  Have recommended rest application of heat massage and muscle relaxant Also recommend taking her blood pressure pill regularly and monitoring pressure at home Final Clinical Impressions(s) / UC Diagnoses   Final diagnoses:  None   Discharge Instructions   None    ED Prescriptions    None     Controlled Substance Prescriptions  Controlled Substance Registry consulted? No   Wardell Honour, MD 02/26/18 [phone]

## 2018-02-26 NOTE — ED Triage Notes (Signed)
Pt states she woke up with left upper chest pain that radiates to her left shoulder and left back on Monday morning.  She states it has been constant with sharp pains.  She reports it hurts more when she puts her head back, she can feel it pulling. The radiation does not go down into her arm.  The pain is reproducible with palpation.  She denies any SOB, numbness, tingling, or headache.  She does report some mild nausea yesterday.  She takes her BP medication at night and her BP is elevated here in the clinic today.

## 2018-02-27 ENCOUNTER — Ambulatory Visit: Payer: Managed Care, Other (non HMO)

## 2018-05-06 ENCOUNTER — Other Ambulatory Visit: Payer: Self-pay | Admitting: Internal Medicine

## 2018-05-07 ENCOUNTER — Other Ambulatory Visit: Payer: Self-pay | Admitting: Family Medicine

## 2018-05-07 MED ORDER — AMLODIPINE BESYLATE 5 MG PO TABS: ORAL_TABLET | ORAL | 0 refills | Status: AC

## 2018-05-07 NOTE — Telephone Encounter (Signed)
Was resent by provider

## 2019-02-06 DIAGNOSIS — Z23 Encounter for immunization: Secondary | ICD-10-CM | POA: Diagnosis not present

## 2020-03-18 ENCOUNTER — Ambulatory Visit: Payer: Self-pay | Attending: Internal Medicine

## 2020-03-18 DIAGNOSIS — Z23 Encounter for immunization: Secondary | ICD-10-CM

## 2020-03-18 NOTE — Progress Notes (Signed)
   Covid-19 Vaccination Clinic  Name:  SAMMANTHA MEHLHAFF    MRN: [id number] DOB: [birth year]  03/18/2020  Ms. Arambula was observed post Covid-19 immunization for 15 minutes without incident. She was provided with Vaccine Information Sheet and instruction to access the V-Safe system.   Ms. Langsam was instructed to call 911 with any severe reactions post vaccine: Marland Kitchen Difficulty breathing  . Swelling of face and throat  . A fast heartbeat  . A bad rash all over body  . Dizziness and weakness   Immunizations Administered    Name Date Dose VIS Date Route   Pfizer COVID-19 Vaccine 03/18/2020  3:19 PM 0.3 mL 02/17/2020 Intramuscular   Manufacturer: [place]   Lot: [id number]   [place]: [id number]

## 2020-04-08 ENCOUNTER — Ambulatory Visit: Payer: Self-pay | Attending: Internal Medicine

## 2020-04-08 DIAGNOSIS — Z23 Encounter for immunization: Secondary | ICD-10-CM

## 2020-04-08 NOTE — Progress Notes (Signed)
   Covid-19 Vaccination Clinic  Name:  Laurie Herrera    MRN: [id number] DOB: 1960-08-09  04/08/2020  [name] was observed post Covid-19 immunization for 15 minutes without incident. She was provided with Vaccine Information Sheet and instruction to access the V-Safe system.   [name] was instructed to call 911 with any severe reactions post vaccine: Marland Kitchen Difficulty breathing  . Swelling of face and throat  . A fast heartbeat  . A bad rash all over body  . Dizziness and weakness   Immunizations Administered    Name Date Dose VIS Date Route   Pfizer COVID-19 Vaccine 04/08/2020  3:09 PM 0.3 mL 02/17/2020 Intramuscular   Manufacturer: [place]   Lot: [id number]   [place]: [id number]

## 2020-05-08 NOTE — Progress Notes (Signed)
° ° °  SUBJECTIVE:   CHIEF COMPLAINT / HPI: physical   Laurie Herrera is a 60 y.o. female with hx of HTN, GERD and atypical chest pain who is presenting today for a physical.  She reports symptoms of intermittent hip pain today. This usually bothers her at night while trying to sleep in a side lying position. She reports that the pain is on the left side and feel dull and achy but is relieved when she sleeps with a pillow between her thighs. She denies any recent trauma to this area and can walk normally. Pain is not present with sitting.   Healthcare Maintance Patient is due for pap smear, mammogram and influenza vaccine today. Patient is due for colonoscopy, mammogram and pap smear, she is agreeable to arranging these today.   PERTINENT  PMH / PSH:  Obesity  HTN   OBJECTIVE:   BP 140/80    Pulse 86    Ht 5' (1.524 m)    Wt 184 lb (83.5 kg)    LMP 03/17/2011    SpO2 96%    BMI 35.94 kg/m   Hip:  - Inspection: No gross deformity, no swelling, erythema, or ecchymosis - Palpation: No TTP, specifically none over greater trochanter - ROM: Normal range of motion on Flexion, extension, abduction, internal and external rotation - Strength: Normal strength. - Neuro/vasc: NV intact distally  General: female appearing stated age in no acute distress Cardio: Normal S1 and S2, no S3 or S4. Rhythm is regular. No murmurs or rubs.  Bilateral radial pulses palpable Pulm: Clear to auscultation bilaterally, no crackles, wheezing, or diminished breath sounds. Normal respiratory effort Abdomen: Bowel sounds normal. Abdomen soft and non-tender.  Extremities: trace peripheral edema. Warm/ well perfused. Neuro: pt alert and oriented x4   Genitalia:  Normal introitus for age, no external lesions, no vaginal discharge, mucosa pink and moist, no vaginal or cervical lesions, no vaginal atrophy, no friaility or hemorrhage   ASSESSMENT/PLAN:   Hip pain, left Hip pain that is present only while lying down  for sleep could be due to some strain on hip ligaments. No red flag signs on my exam today. Pain relieved with using pillow to balance positioning of hips.  -recommended conservative measures  -advised patient to follow up if pain becomes more severe, impedes her ability to ambulate, or worsens as we can pursue imaging  Essential hypertension BP slightly elevated above goal range today. Patient to continue amlodipine. -bmp   ANEMIA, OTHER, UNSPECIFIED Has not had Hgb checked recently. Denies presence of fatigue recently.  CBC   Healthcare maintenance Colonoscopy referral submitted  Mammogram ordered  Lipid panel collected today  Pap smear completed today      Eulis Foster, MD [place]

## 2020-05-09 ENCOUNTER — Other Ambulatory Visit (HOSPITAL_COMMUNITY)
Admission: RE | Admit: 2020-05-09 | Discharge: 2020-05-09 | Disposition: A | Discharge status: Home / Self Care | Payer: 59 | Source: Ambulatory Visit | Attending: Family Medicine | Admitting: Family Medicine

## 2020-05-09 ENCOUNTER — Encounter: Payer: Self-pay | Admitting: Family Medicine

## 2020-05-09 ENCOUNTER — Other Ambulatory Visit: Payer: Self-pay

## 2020-05-09 ENCOUNTER — Ambulatory Visit (INDEPENDENT_AMBULATORY_CARE_PROVIDER_SITE_OTHER): Payer: 59 | Admitting: Family Medicine

## 2020-05-09 VITALS — BP 140/80 | HR 86 | Ht 60.0 in | Wt 184.0 lb

## 2020-05-09 DIAGNOSIS — Z124 Encounter for screening for malignant neoplasm of cervix: Secondary | ICD-10-CM | POA: Diagnosis not present

## 2020-05-09 DIAGNOSIS — Z6835 Body mass index (BMI) 35.0-35.9, adult: Secondary | ICD-10-CM

## 2020-05-09 DIAGNOSIS — Z1231 Encounter for screening mammogram for malignant neoplasm of breast: Secondary | ICD-10-CM

## 2020-05-09 DIAGNOSIS — M25552 Pain in left hip: Secondary | ICD-10-CM | POA: Diagnosis not present

## 2020-05-09 DIAGNOSIS — Z0000 Encounter for general adult medical examination without abnormal findings: Secondary | ICD-10-CM | POA: Diagnosis not present

## 2020-05-09 DIAGNOSIS — D649 Anemia, unspecified: Secondary | ICD-10-CM

## 2020-05-09 DIAGNOSIS — Z1211 Encounter for screening for malignant neoplasm of colon: Secondary | ICD-10-CM

## 2020-05-09 DIAGNOSIS — E669 Obesity, unspecified: Secondary | ICD-10-CM

## 2020-05-09 DIAGNOSIS — I10 Essential (primary) hypertension: Secondary | ICD-10-CM | POA: Diagnosis not present

## 2020-05-09 MED ORDER — GABAPENTIN 100 MG PO CAPS: 100.0000 mg | ORAL_CAPSULE | Freq: Every day | ORAL | 0 refills | Status: AC

## 2020-05-09 NOTE — Patient Instructions (Signed)
I have referred you to gastroenterology for a colonoscopy to screen for any colon cancer. Be on the lookout for a call from their office to schedule an appointment.   Of also submit an order for you to have a mammogram to screen for any signs of breast cancer at the [facility].    We will check blood work today to look at your electrolytes and kidney function as well as your cholesterol levels.  For your daughter:   Managing the Challenge of Quitting Smoking Quitting smoking is a physical and mental challenge. You will face cravings, withdrawal symptoms, and temptation. Before quitting, work with your health care provider to make a plan that can help you manage quitting. Preparation can help you quit and keep you from giving in. How to manage lifestyle changes Managing stress Stress can make you want to smoke, and wanting to smoke may cause stress. It is important to find ways to manage your stress. You might try some of the following:  Practice relaxation techniques. ? Breathe slowly and deeply, in through your nose and out through your mouth. ? Listen to music. ? Soak in a bath or take a shower. ? Imagine a peaceful place or vacation.  Get some support. ? Talk with family or friends about your stress. ? Join a support group. ? Talk with a counselor or therapist.  Get some physical activity. ? Go for a walk, run, or bike ride. ? Play a favorite sport. ? Practice yoga.   Medicines Talk with your health care provider about medicines that might help you deal with cravings and make quitting easier for you. Relationships Social situations can be difficult when you are quitting smoking. To manage this, you can:  Avoid parties and other social situations where people might be smoking.  Avoid alcohol.  Leave right away if you have the urge to smoke.  Explain to your family and friends that you are quitting smoking. Ask for support and let them know you might be a bit  grumpy.  Plan activities where smoking is not an option. General instructions Be aware that many people gain weight after they quit smoking. However, not everyone does. To keep from gaining weight, have a plan in place before you quit and stick to the plan after you quit. Your plan should include:  Having healthy snacks. When you have a craving, it may help to: ? Eat popcorn, carrots, celery, or other cut vegetables. ? Chew sugar-free gum.  Changing how you eat. ? Eat small portion sizes at meals. ? Eat 4-6 small meals throughout the day instead of 1-2 large meals a day. ? Be mindful when you eat. Do not watch television or do other things that might distract you as you eat.  Exercising regularly. ? Make time to exercise each day. If you do not have time for a long workout, do short bouts of exercise for 5-10 minutes several times a day. ? Do some form of strengthening exercise, such as weight lifting. ? Do some exercise that gets your heart beating and causes you to breathe deeply, such as walking fast, running, swimming, or biking. This is very important.  Drinking plenty of water or other low-calorie or no-calorie drinks. Drink 6-8 glasses of water daily.   How to recognize withdrawal symptoms Your body and mind may experience discomfort as you try to get used to not having nicotine in your system. These effects are called withdrawal symptoms. They may include:  Feeling  hungrier than normal.  Having trouble concentrating.  Feeling irritable or restless.  Having trouble sleeping.  Feeling depressed.  Craving a cigarette. To manage withdrawal symptoms:  Avoid places, people, and activities that trigger your cravings.  Remember why you want to quit.  Get plenty of sleep.  Avoid coffee and other caffeinated drinks. These may worsen some of your symptoms. These symptoms may surprise you. But be assured that they are normal to have when quitting smoking. How to manage  cravings Come up with a plan for how to deal with your cravings. The plan should include the following:  A definition of the specific situation you want to deal with.  An alternative action you will take.  A clear idea for how this action will help.  The name of someone who might help you with this. Cravings usually last for 5-10 minutes. Consider taking the following actions to help you with your plan to deal with cravings:  Keep your mouth busy. ? Chew sugar-free gum. ? Suck on hard candies or a straw. ? Brush your teeth.  Keep your hands and body busy. ? Change to a different activity right away. ? Squeeze or play with a ball. ? Do an activity or a hobby, such as making bead jewelry, practicing needlepoint, or working with wood. ? Mix up your normal routine. ? Take a short exercise break. Go for a quick walk or run up and down stairs.  Focus on doing something kind or helpful for someone else.  Call a friend or family member to talk during a craving.  Join a support group.  Contact a quitline. Where to find support To get help or find a support group:  Call the [facility]'s Smoking Quitline: 1-800-QUIT NOW 939-307-9364)  Visit the website of the Substance Abuse and [place]: ktimeonline.com  Text QUIT to SmokefreeTXT: 371062 Where to find more information Visit these websites to find more information on quitting smoking:  [place]: www.smokefree.gov  American Lung Association: www.lung.org  American Cancer Society: www.cancer.org  Centers for Disease Control and Prevention: http://www.wolf.info/  American Heart Association: www.heart.org Contact a health care provider if:  You want to change your plan for quitting.  The medicines you are taking are not helping.  Your eating feels out of control or you cannot sleep. Get help right away if:  You feel depressed or become very anxious. Summary  Quitting  smoking is a physical and mental challenge. You will face cravings, withdrawal symptoms, and temptation to smoke again. Preparation can help you as you go through these challenges.  Try different techniques to manage stress, handle social situations, and prevent weight gain.  You can deal with cravings by keeping your mouth busy (such as by chewing gum), keeping your hands and body busy, calling family or friends, or contacting a quitline for people who want to quit smoking.  You can deal with withdrawal symptoms by avoiding places where people smoke, getting plenty of rest, and avoiding drinks with caffeine. This information is not intended to replace advice given to you by your health care provider. Make sure you discuss any questions you have with your health care provider. Document Revised: 02/03/2019 Document Reviewed: 02/03/2019 Elsevier Patient Education  [place].

## 2020-05-10 DIAGNOSIS — R87610 Atypical squamous cells of undetermined significance on cytologic smear of cervix (ASC-US): Secondary | ICD-10-CM | POA: Diagnosis not present

## 2020-05-10 LAB — CBC WITH DIFFERENTIAL/PLATELET
Basophils Absolute: 0.1 10*3/uL (ref 0.0–0.2)
Basos: 1 %
EOS (ABSOLUTE): 0.2 10*3/uL (ref 0.0–0.4)
Eos: 1 %
Hematocrit: 40.7 % (ref 34.0–46.6)
Hemoglobin: 13.2 g/dL (ref 11.1–15.9)
Immature Grans (Abs): 0 10*3/uL (ref 0.0–0.1)
Immature Granulocytes: 0 %
Lymphocytes Absolute: 4.3 10*3/uL — ABNORMAL HIGH (ref 0.7–3.1)
Lymphs: 36 %
MCH: 25.5 pg — ABNORMAL LOW (ref 26.6–33.0)
MCHC: 32.4 g/dL (ref 31.5–35.7)
MCV: 79 fL (ref 79–97)
Monocytes Absolute: 0.6 10*3/uL (ref 0.1–0.9)
Monocytes: 5 %
Neutrophils Absolute: 6.8 10*3/uL (ref 1.4–7.0)
Neutrophils: 57 %
Platelets: 328 10*3/uL (ref 150–450)
RBC: 5.18 x10E6/uL (ref 3.77–5.28)
RDW: 13.4 % (ref 11.7–15.4)
WBC: 12 10*3/uL — ABNORMAL HIGH (ref 3.4–10.8)

## 2020-05-10 LAB — LIPID PANEL
Chol/HDL Ratio: 5 ratio — ABNORMAL HIGH (ref 0.0–4.4)
Cholesterol, Total: 213 mg/dL — ABNORMAL HIGH (ref 100–199)
HDL: 43 mg/dL (ref >39)
LDL Chol Calc (NIH): 148 mg/dL — ABNORMAL HIGH (ref 0–99)
Triglycerides: 120 mg/dL (ref 0–149)
VLDL Cholesterol Cal: 22 mg/dL (ref 5–40)

## 2020-05-10 LAB — BASIC METABOLIC PANEL
BUN/Creatinine Ratio: 14 (ref 12–28)
BUN: 12 mg/dL (ref 8–27)
CO2: 27 mmol/L (ref 20–29)
Calcium: 9.6 mg/dL (ref 8.7–10.3)
Chloride: 104 mmol/L (ref 96–106)
Creatinine, Ser: 0.88 mg/dL (ref 0.57–1.00)
GFR calc Af Amer: 83 mL/min/{1.73_m2} (ref >59)
GFR calc non Af Amer: 72 mL/min/{1.73_m2} (ref >59)
Glucose: 75 mg/dL (ref 65–99)
Potassium: 4.3 mmol/L (ref 3.5–5.2)
Sodium: 140 mmol/L (ref 134–144)

## 2020-05-11 DIAGNOSIS — M25552 Pain in left hip: Secondary | ICD-10-CM | POA: Insufficient documentation

## 2020-05-11 NOTE — Assessment & Plan Note (Addendum)
BP slightly elevated above goal range today. Patient to continue amlodipine. -bmp

## 2020-05-11 NOTE — Assessment & Plan Note (Addendum)
Colonoscopy referral submitted  Mammogram ordered  Lipid panel collected today  Pap smear completed today

## 2020-05-11 NOTE — Assessment & Plan Note (Signed)
Has not had Hgb checked recently. Denies presence of fatigue recently.  CBC

## 2020-05-11 NOTE — Assessment & Plan Note (Signed)
Hip pain that is present only while lying down for sleep could be due to some strain on hip ligaments. No red flag signs on my exam today. Pain relieved with using pillow to balance positioning of hips.  -recommended conservative measures  -advised patient to follow up if pain becomes more severe, impedes her ability to ambulate, or worsens as we can pursue imaging

## 2020-05-12 LAB — CYTOLOGY - PAP
Comment: NEGATIVE
Diagnosis: UNDETERMINED — AB
High risk HPV: NEGATIVE

## 2020-06-16 ENCOUNTER — Ambulatory Visit (AMBULATORY_SURGERY_CENTER): Payer: Self-pay | Admitting: *Deleted

## 2020-06-16 ENCOUNTER — Other Ambulatory Visit: Payer: Self-pay

## 2020-06-16 VITALS — Ht 60.0 in | Wt 183.0 lb

## 2020-06-16 DIAGNOSIS — Z86010 Personal history of colonic polyps: Secondary | ICD-10-CM

## 2020-06-16 NOTE — Progress Notes (Signed)
Patient is here in-person for PV. Patient denies any allergies to eggs or soy. Patient denies any problems with anesthesia/sedation. Patient denies any oxygen use at home. Patient denies taking any diet/weight loss medications or blood thinners. Patient is not being treated for MRSA or C-diff. Patient is aware of our care-partner policy and TOIZT-24 safety protocol.  COVID-19 vaccines completed x2, per patient.

## 2020-07-04 ENCOUNTER — Encounter: Payer: Self-pay | Admitting: Gastroenterology

## 2020-07-15 ENCOUNTER — Encounter: Payer: Self-pay | Admitting: Gastroenterology

## 2020-07-15 ENCOUNTER — Other Ambulatory Visit: Payer: Self-pay

## 2020-07-15 ENCOUNTER — Ambulatory Visit (AMBULATORY_SURGERY_CENTER): Payer: 59 | Admitting: Gastroenterology

## 2020-07-15 VITALS — BP 109/69 | HR 68 | Temp 97.1°F | Resp 14 | Ht 60.0 in | Wt 183.0 lb

## 2020-07-15 DIAGNOSIS — Z860100 Personal history of colon polyps, unspecified: Secondary | ICD-10-CM

## 2020-07-15 DIAGNOSIS — Z1211 Encounter for screening for malignant neoplasm of colon: Secondary | ICD-10-CM | POA: Diagnosis not present

## 2020-07-15 MED ORDER — SODIUM CHLORIDE 0.9 % IV SOLN: 500.0000 mL | Freq: Once | INTRAVENOUS | Status: DC

## 2020-07-15 NOTE — Progress Notes (Signed)
pt tolerated well. VSS. awake and to recovery. Report given to RN.  

## 2020-07-15 NOTE — Progress Notes (Signed)
Pt's states no medical or surgical changes since previsit or office visit. VS by CW. 

## 2020-07-15 NOTE — Patient Instructions (Signed)
Handouts on hemorrhoids given to you today   High fiber diet recommended  FiberCon recommended 1-2 tablets daily    YOU HAD AN ENDOSCOPIC PROCEDURE TODAY AT [facility]:   Refer to the procedure report that was given to you for any specific questions about what was found during the examination.  If the procedure report does not answer your questions, please call your gastroenterologist to clarify.  If you requested that your care partner not be given the details of your procedure findings, then the procedure report has been included in a sealed envelope for you to review at your convenience later.  YOU SHOULD EXPECT: Some feelings of bloating in the abdomen. Passage of more gas than usual.  Walking can help get rid of the air that was put into your GI tract during the procedure and reduce the bloating. If you had a lower endoscopy (such as a colonoscopy or flexible sigmoidoscopy) you may notice spotting of blood in your stool or on the toilet paper. If you underwent a bowel prep for your procedure, you may not have a normal bowel movement for a few days.  Please Note:  You might notice some irritation and congestion in your nose or some drainage.  This is from the oxygen used during your procedure.  There is no need for concern and it should clear up in a day or so.  SYMPTOMS TO REPORT IMMEDIATELY:   Following lower endoscopy (colonoscopy or flexible sigmoidoscopy):  Excessive amounts of blood in the stool  Significant tenderness or worsening of abdominal pains  Swelling of the abdomen that is new, acute  Fever of 100F or higher  For urgent or emergent issues, a gastroenterologist can be reached at any hour by calling [phone]. Do not use MyChart messaging for urgent concerns.    DIET:  We do recommend a small meal at first, but then you may proceed to your regular diet.  Drink plenty of fluids but you should avoid alcoholic beverages for 24 hours.  ACTIVITY:  You  should plan to take it easy for the rest of today and you should NOT DRIVE or use heavy machinery until tomorrow (because of the sedation medicines used during the test).    FOLLOW UP: Our staff will call the number listed on your records 48-72 hours following your procedure to check on you and address any questions or concerns that you may have regarding the information given to you following your procedure. If we do not reach you, we will leave a message.  We will attempt to reach you two times.  During this call, we will ask if you have developed any symptoms of COVID 19. If you develop any symptoms (ie: fever, flu-like symptoms, shortness of breath, cough etc.) before then, please call [phone].  If you test positive for Covid 19 in the 2 weeks post procedure, please call and report this information to [place].    SIGNATURES/CONFIDENTIALITY: You and/or your care partner have signed paperwork which will be entered into your electronic medical record.  These signatures attest to the fact that that the information above on your After Visit Summary has been reviewed and is understood.  Full responsibility of the confidentiality of this discharge information lies with you and/or your care-partner.

## 2020-07-15 NOTE — Op Note (Signed)
[place] [facility] Patient Name: Laurie Herrera Procedure Date: 07/15/2020 3:04 PM MRN: [id number] Endoscopist: Justice Britain , MD Age: 60 Referring MD:  Date of Birth: 1960-10-22 Gender: Female Account #: [id number] Procedure:                Colonoscopy Indications:              Surveillance: Personal history of adenomatous                            polyps on last colonoscopy > 5 years ago Medicines:                Monitored Anesthesia Care Procedure:                Pre-Anesthesia Assessment:                           - Prior to the procedure, a History and Physical                            was performed, and patient medications and                            allergies were reviewed. The patient's tolerance of                            previous anesthesia was also reviewed. The risks                            and benefits of the procedure and the sedation                            options and risks were discussed with the patient.                            All questions were answered, and informed consent                            was obtained. Prior Anticoagulants: The patient has                            taken no previous anticoagulant or antiplatelet                            agents except for aspirin. ASA Grade Assessment: II                            - A patient with mild systemic disease. After                            reviewing the risks and benefits, the patient was                            deemed in satisfactory condition to undergo the  procedure.                           After obtaining informed consent, the colonoscope                            was passed under direct vision. Throughout the                            procedure, the patient's blood pressure, pulse, and                            oxygen saturations were monitored continuously. The                            Olympus PCF-[id number] (#[id number]) Colonoscope was                             introduced through the anus and advanced to the 5                            cm into the ileum. The colonoscopy was somewhat                            difficult due to a redundant colon. Successful                            completion of the procedure was aided by changing                            the patient's position, changing the patient to a                            prone position, using manual pressure,                            straightening and shortening the scope to obtain                            bowel loop reduction and using scope torsion. The                            patient tolerated the procedure. The quality of the                            bowel preparation was good. The terminal ileum,                            ileocecal valve, appendiceal orifice, and rectum                            were photographed. Scope In: 3:15:49 PM Scope Out: 3:33:46 PM Scope Withdrawal Time: 0 hours 11 minutes 18 seconds  Total Procedure Duration: 0 hours 17 minutes 57  seconds  Findings:                 The digital rectal exam findings include                            hemorrhoids. Pertinent negatives include no                            palpable rectal lesions.                           The colon (entire examined portion) revealed                            significantly excessive looping.                           The terminal ileum and ileocecal valve appeared                            normal.                           Normal mucosa was found in the entire colon                            otherwise.                           Non-bleeding non-thrombosed external and internal                            hemorrhoids were found during retroflexion, during                            perianal exam and during digital exam. The                            hemorrhoids were Grade II (internal hemorrhoids                            that prolapse but reduce  spontaneously). Complications:            No immediate complications. Estimated Blood Loss:     Estimated blood loss: none. Impression:               - Hemorrhoids found on digital rectal exam.                           - There was significant looping of the colon.                           - The examined portion of the ileum was normal.                           - Normal mucosa in the entire examined colon.                           -  Non-bleeding non-thrombosed external and internal                            hemorrhoids. Recommendation:           - The patient will be observed post-procedure,                            until all discharge criteria are met.                           - Discharge patient to home.                           - Patient has a contact number available for                            emergencies. The signs and symptoms of potential                            delayed complications were discussed with the                            patient. Return to normal activities tomorrow.                            Written discharge instructions were provided to the                            patient.                           - High fiber diet.                           - Use FiberCon 1-2 tablets PO daily.                           - Continue present medications.                           - Repeat colonoscopy in 7 years for surveillance                            due to prior history of colon polyps and none found                            today. If at that time there are no further polyps                            noted then may go back to average risk screening.                           - The findings and recommendations were discussed  with the patient.                           - The findings and recommendations were discussed                            with the patient's family. Justice Britain, MD 07/15/2020 3:41:43 PM

## 2020-07-19 ENCOUNTER — Telehealth: Payer: Self-pay | Admitting: *Deleted

## 2020-07-19 NOTE — Telephone Encounter (Signed)
  Follow up Call-  Call back number 07/15/2020  Post procedure Call Back phone  # [phone]  Permission to leave phone message Yes  Some recent data might be hidden     Patient questions:  Do you have a fever, pain , or abdominal swelling? No. Pain Score  0 *  Have you tolerated food without any problems? Yes.    Have you been able to return to your normal activities? Yes.    Do you have any questions about your discharge instructions: Diet   No. Medications  No. Follow up visit  No.  Do you have questions or concerns about your Care? No.  Actions: * If pain score is 4 or above: No action needed, pain <4.  1. Have you developed a fever since your procedure? no  2.   Have you had an respiratory symptoms (SOB or cough) since your procedure? no  3.   Have you tested positive for COVID 19 since your procedure no  4.   Have you had any family members/close contacts diagnosed with the COVID 19 since your procedure?  no   If yes to any of these questions please route to Joylene John, RN and Joella Prince, RN

## 2020-07-19 NOTE — Telephone Encounter (Signed)
Attempted f/u phone call. Mailbox full, unable to leave message.

## 2020-11-24 ENCOUNTER — Ambulatory Visit
Admission: RE | Admit: 2020-11-24 | Discharge: 2020-11-24 | Disposition: A | Discharge status: Home / Self Care | Payer: Managed Care, Other (non HMO) | Source: Ambulatory Visit | Attending: Family Medicine | Admitting: Family Medicine

## 2020-11-24 ENCOUNTER — Other Ambulatory Visit: Payer: Self-pay

## 2020-11-24 DIAGNOSIS — Z1231 Encounter for screening mammogram for malignant neoplasm of breast: Secondary | ICD-10-CM

## 2023-03-15 IMAGING — MG MM DIGITAL SCREENING BILAT W/ TOMO AND CAD
8 series · 8 of 24 positions shown · non-contrast
Comparison: Previous exam(s).

CLINICAL DATA: Screening.

EXAM:
DIGITAL SCREENING BILATERAL MAMMOGRAM WITH TOMOSYNTHESIS AND CAD
TECHNIQUE: Bilateral screening digital craniocaudal and mediolateral oblique
mammograms were obtained. Bilateral screening digital breast
tomosynthesis was performed. The images were evaluated with
computer-aided detection.

[L CC synth-2D]
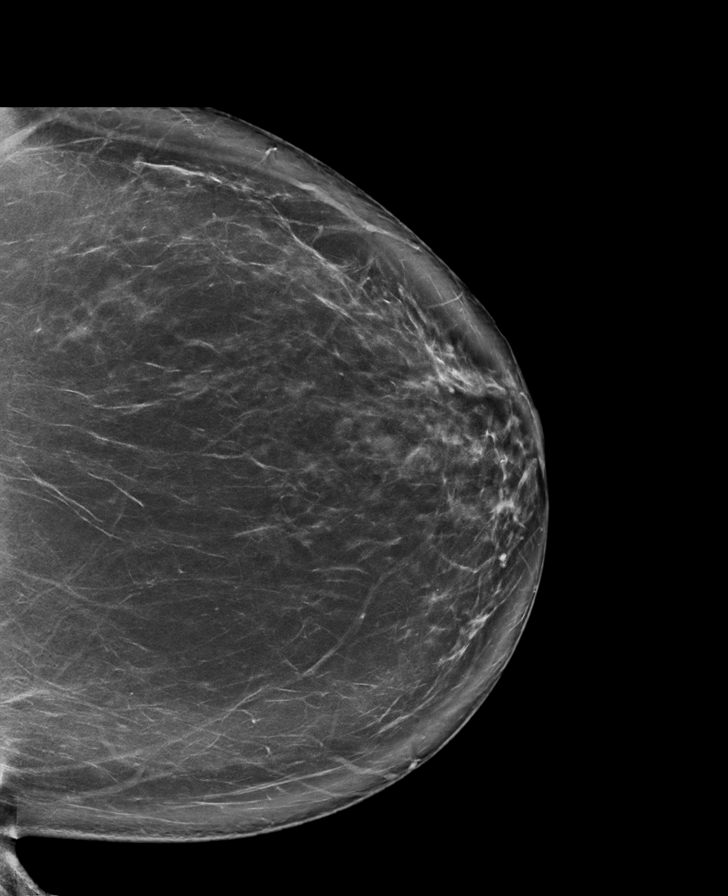

[L MLO synth-2D]
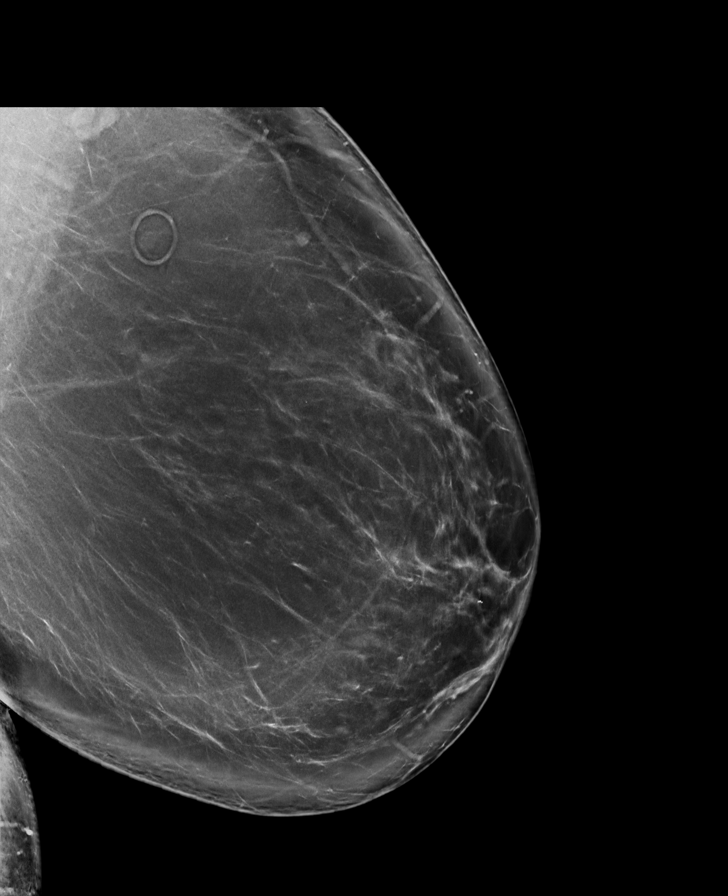

[R CC synth-2D]
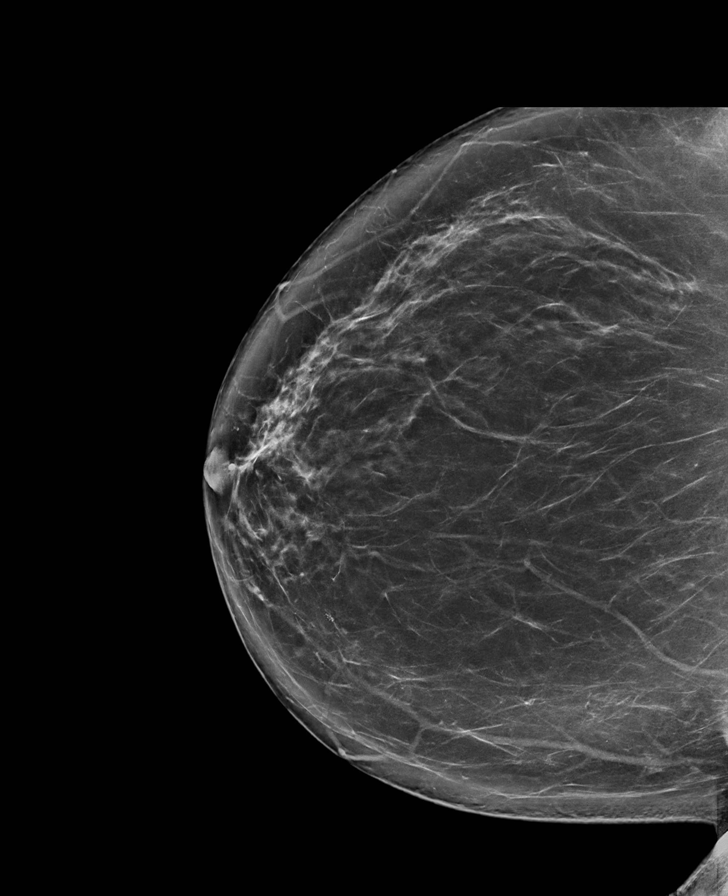

[R MLO synth-2D]
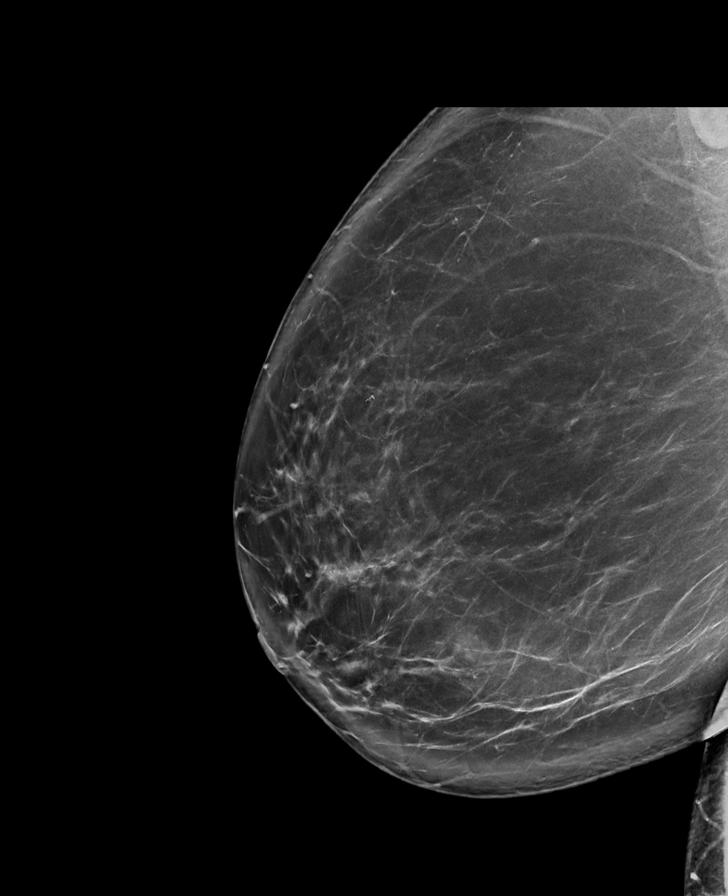

[R MLO tomo · tomo slice 57/113.0]
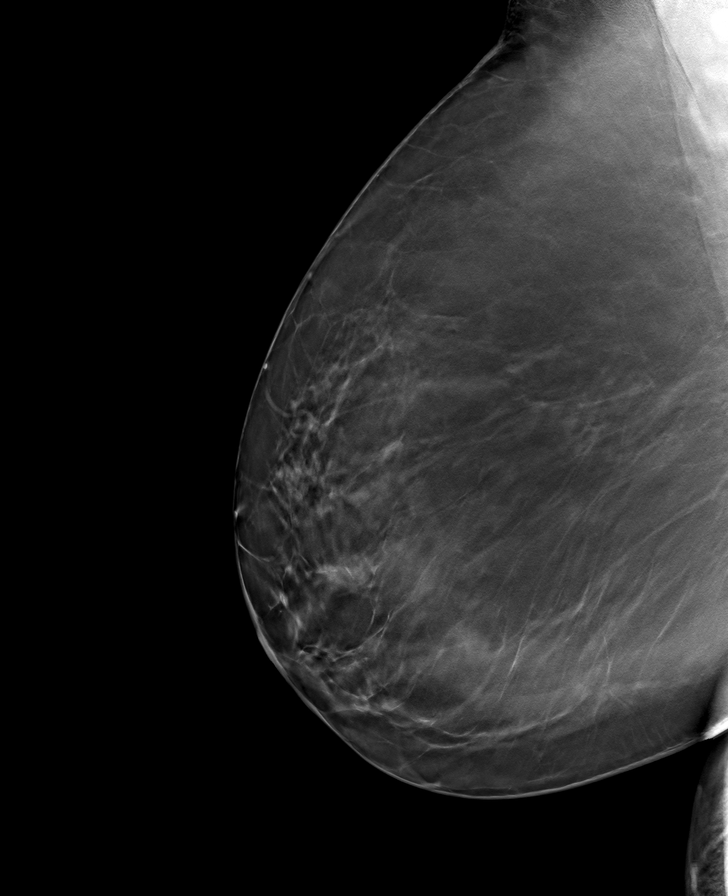

[L CC tomo · tomo slice 52/103.0]
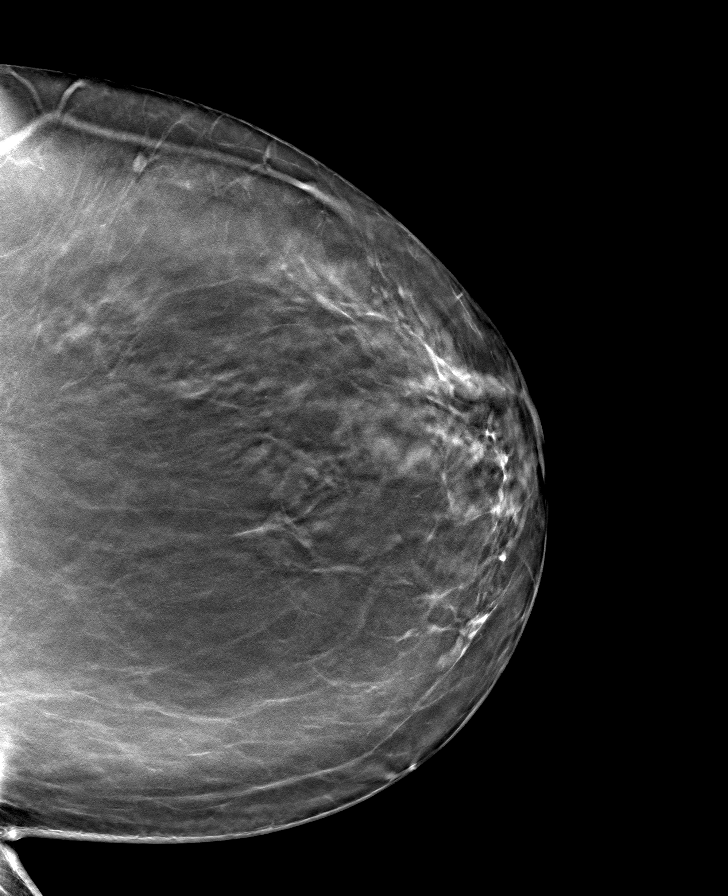

[R CC tomo · tomo slice 49/98.0]
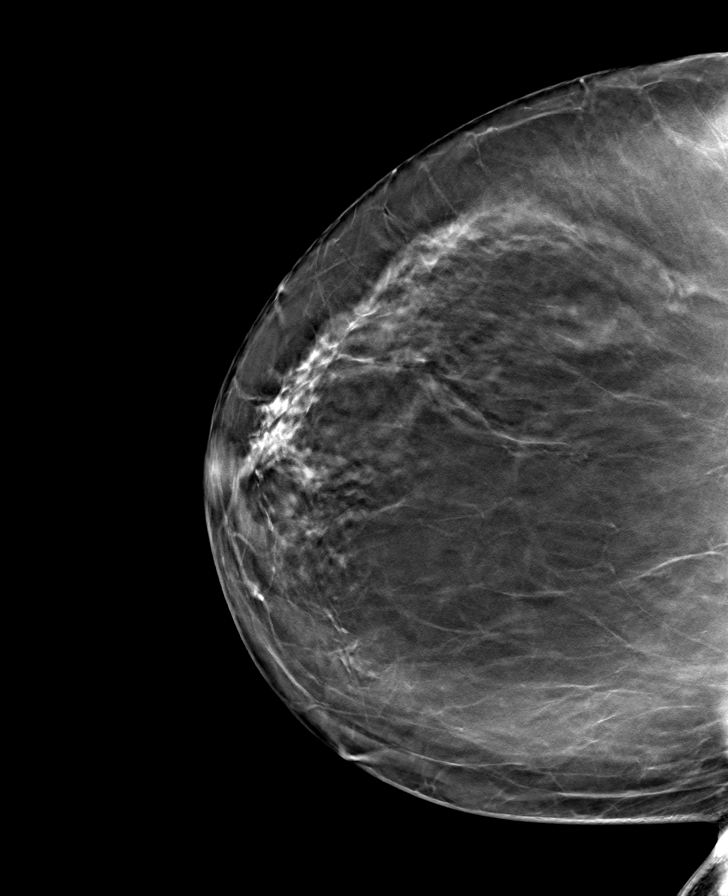

[L MLO tomo · tomo slice 61/120.0]
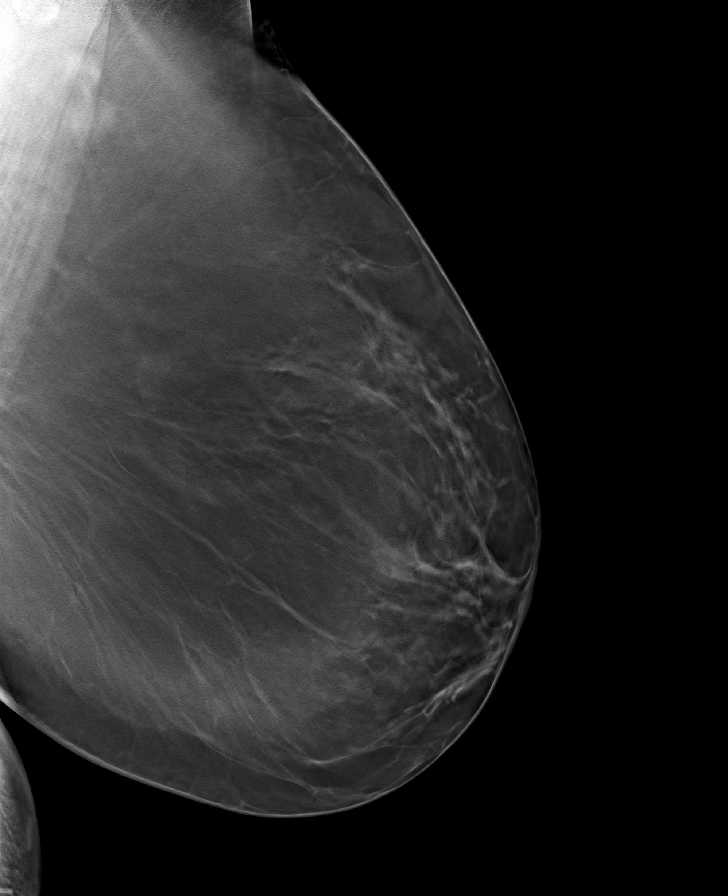

[8 of 24 positions shown; findings below may reference images not displayed]

ACR Breast Density Category b: There are scattered areas of
fibroglandular density.
FINDINGS: There are no findings suspicious for malignancy.
IMPRESSION: No mammographic evidence of malignancy. A result letter of this
screening mammogram will be mailed directly to the patient.

RECOMMENDATION:
Screening mammogram in one year. (Code:51-O-LD2)

BI-RADS CATEGORY  1: Negative.

## 2023-05-27 ENCOUNTER — Ambulatory Visit (INDEPENDENT_AMBULATORY_CARE_PROVIDER_SITE_OTHER): Payer: BC Managed Care – PPO | Admitting: Family Medicine

## 2023-05-27 VITALS — BP 161/86 | HR 82 | Ht 60.0 in | Wt 178.6 lb

## 2023-05-27 DIAGNOSIS — Z131 Encounter for screening for diabetes mellitus: Secondary | ICD-10-CM | POA: Insufficient documentation

## 2023-05-27 DIAGNOSIS — Z1231 Encounter for screening mammogram for malignant neoplasm of breast: Secondary | ICD-10-CM

## 2023-05-27 DIAGNOSIS — E785 Hyperlipidemia, unspecified: Secondary | ICD-10-CM | POA: Diagnosis not present

## 2023-05-27 DIAGNOSIS — D649 Anemia, unspecified: Secondary | ICD-10-CM

## 2023-05-27 DIAGNOSIS — M25511 Pain in right shoulder: Secondary | ICD-10-CM

## 2023-05-27 DIAGNOSIS — I10 Essential (primary) hypertension: Secondary | ICD-10-CM

## 2023-05-27 DIAGNOSIS — Z7689 Persons encountering health services in other specified circumstances: Secondary | ICD-10-CM

## 2023-05-27 LAB — POCT GLYCOSYLATED HEMOGLOBIN (HGB A1C): Hemoglobin A1C: 5.9 % — AB (ref 4.0–5.6)

## 2023-05-27 NOTE — Patient Instructions (Addendum)
Try taking melatonin 3 to 5 mg nightly before bed.  You can also try going to bed later at night.  I recommend staying off of all screens for at least 1 hour before bed to help with sleep.  I have sent in meloxicam to help with your shoulder pain.  Continue your Tylenol gel.  When you come back in 1 month, if you are still having symptoms, we may get an x-ray and physical therapy.  We collected multiple labs today.  I will follow-up the results with you.  I also ordered a mammogram for you today.  Keep me updated on how you are doing, and let me know if you have any questions!

## 2023-05-27 NOTE — Assessment & Plan Note (Signed)
No updated panel on file. Will update CBC.

## 2023-05-27 NOTE — Assessment & Plan Note (Addendum)
Persistently elevated today. Not on medications. Obtain BMP today to assess renal function.

## 2023-05-27 NOTE — Assessment & Plan Note (Signed)
Will obtain A1c given age and risk factors.

## 2023-05-27 NOTE — Progress Notes (Unsigned)
    SUBJECTIVE:   CHIEF COMPLAINT / HPI:   Re-establish care Previously seen at our practice in 2022.  Right shoulder pain Started 2 weeks ago. 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 No real trauma. Worse at night when relaxing. Has been using tylenol cream and stretches which have helped both.  Sleeping patterns off Worsening since menopause. Difficult to go to sleep and stay asleep. Typically goes to bed around 9 PM and wakes up around 2-3 AM. Also sometimes can't get to sleep until later in the night. She drinks chamomile tea and a lavender diffuser to help.  PERTINENT  PMH / PSH: HTN, GERD, elevated BMI, anemia  OBJECTIVE:   BP (!) 145/80   Pulse 82   Ht 5' (1.524 m)   Wt 178 lb 9.6 oz (81 kg)   LMP 03/17/2011   SpO2 97%   BMI 34.88 kg/m   General: Alert and oriented, in NAD Skin: Warm, dry, and intact without lesions HEENT: NCAT, EOM grossly normal, midline nasal septum Cardiac: RRR, no m/r/g appreciated Respiratory: CTAB, breathing and speaking comfortably on RA Abdominal: Soft, nontender, nondistended, normoactive bowel sounds Extremities: Moves all extremities grossly equally Neurological: No gross focal deficit Psychiatric: Appropriate mood and affect   ASSESSMENT/PLAN:   Essential hypertension Persistently elevated today. Not on medications. Obtain BMP today to assess renal function.  ANEMIA, OTHER, UNSPECIFIED No updated panel on file. Will update CBC.  Hyperlipidemia As seen on previous lipid panel. Will update today.  Screening for diabetes mellitus Will obtain A1c given age and risk factors.   Health maintenance Mammogram ordered today. UTD on cervical cancer and colon cancer screenings. Politely declined flu, COVID, shingles, and tetanus vaccinations today.  Janeal Holmes, MD [facility]

## 2023-05-27 NOTE — Assessment & Plan Note (Signed)
As seen on previous lipid panel. Will update today.

## 2023-05-28 ENCOUNTER — Encounter: Payer: Self-pay | Admitting: Family Medicine

## 2023-05-28 DIAGNOSIS — Z7689 Persons encountering health services in other specified circumstances: Secondary | ICD-10-CM | POA: Insufficient documentation

## 2023-05-28 DIAGNOSIS — M25519 Pain in unspecified shoulder: Secondary | ICD-10-CM | POA: Insufficient documentation

## 2023-05-28 LAB — CBC WITH DIFFERENTIAL/PLATELET
Basophils Absolute: 0.1 10*3/uL (ref 0.0–0.2)
Basos: 1 %
EOS (ABSOLUTE): 0.1 10*3/uL (ref 0.0–0.4)
Eos: 1 %
Hematocrit: 39.8 % (ref 34.0–46.6)
Hemoglobin: 13.1 g/dL (ref 11.1–15.9)
Immature Grans (Abs): 0 10*3/uL (ref 0.0–0.1)
Immature Granulocytes: 0 %
Lymphocytes Absolute: 4.4 10*3/uL — ABNORMAL HIGH (ref 0.7–3.1)
Lymphs: 41 %
MCH: 26.2 pg — ABNORMAL LOW (ref 26.6–33.0)
MCHC: 32.9 g/dL (ref 31.5–35.7)
MCV: 80 fL (ref 79–97)
Monocytes Absolute: 0.5 10*3/uL (ref 0.1–0.9)
Monocytes: 5 %
Neutrophils Absolute: 5.6 10*3/uL (ref 1.4–7.0)
Neutrophils: 52 %
Platelets: 303 10*3/uL (ref 150–450)
RBC: 5 x10E6/uL (ref 3.77–5.28)
RDW: 13.8 % (ref 11.7–15.4)
WBC: 10.8 10*3/uL (ref 3.4–10.8)

## 2023-05-28 LAB — BASIC METABOLIC PANEL
BUN/Creatinine Ratio: 13 (ref 12–28)
BUN: 10 mg/dL (ref 8–27)
CO2: 25 mmol/L (ref 20–29)
Calcium: 9.4 mg/dL (ref 8.7–10.3)
Chloride: 105 mmol/L (ref 96–106)
Creatinine, Ser: 0.76 mg/dL (ref 0.57–1.00)
Glucose: 77 mg/dL (ref 70–99)
Potassium: 4 mmol/L (ref 3.5–5.2)
Sodium: 142 mmol/L (ref 134–144)
eGFR: 88 mL/min/{1.73_m2} (ref >59)

## 2023-05-28 LAB — LIPID PANEL
Chol/HDL Ratio: 4.9 {ratio} — ABNORMAL HIGH (ref 0.0–4.4)
Cholesterol, Total: 195 mg/dL (ref 100–199)
HDL: 40 mg/dL (ref >39)
LDL Chol Calc (NIH): 135 mg/dL — ABNORMAL HIGH (ref 0–99)
Triglycerides: 112 mg/dL (ref 0–149)
VLDL Cholesterol Cal: 20 mg/dL (ref 5–40)

## 2023-05-28 NOTE — Assessment & Plan Note (Signed)
Age and suboptimal sleep hygiene contributing.  Discussed trying to remain away from screens at least an hour before bed.  Also discussed giving melatonin another chance and taking 3 to 5 mg.  Continue calming activities before bed.  Recommended pushing back bedtime until 10 PM or 11 PM if refractory to above changes.  Follow-up as needed.

## 2023-05-28 NOTE — Assessment & Plan Note (Addendum)
Reassured by history negative for debilitating pain and by exam today against rotator cuff pathology or frozen shoulder.  While glenohumeral arthritis less likely, she could be experiencing some AC joint degenerative changes.  Discussed conservative management with meloxicam daily for 5 days with patient who is in agreement.  If refractory, consider physical therapy versus sports medicine referral.

## 2023-05-30 ENCOUNTER — Telehealth: Payer: Self-pay | Admitting: Family Medicine

## 2023-05-30 NOTE — Telephone Encounter (Signed)
Attempted to call patient to discuss results without answer. LVM to call back.  A1c mildly elevated to 5.9, absolute lymphocyte count elevated 4.4 (previously elevated 4.3 three years ago), LDL elevated 135, and creatinine normal at 0.76.  When patient calls back, please discuss starting statin to bring down cholesterol (intermediate risk of CVD), losartan to help control BP, and returning for repeat blood testing given elevated lymphocytes. If amenable, I will send in medications. Please schedule her for an appointment to follow up in 2-3 weeks.
